# Patient Record
Sex: Female | Born: 1995 | Race: White | Hispanic: No | Marital: Single | Smoking: Current some day smoker
Health system: Southern US, Community
[De-identification: ages and names within clinical notes are randomized; demographics above are authoritative.]

## PROBLEM LIST (undated history)

## (undated) DIAGNOSIS — E063 Autoimmune thyroiditis: Secondary | ICD-10-CM

## (undated) DIAGNOSIS — F329 Major depressive disorder, single episode, unspecified: Secondary | ICD-10-CM

## (undated) DIAGNOSIS — F419 Anxiety disorder, unspecified: Secondary | ICD-10-CM

## (undated) DIAGNOSIS — F32A Depression, unspecified: Secondary | ICD-10-CM

## (undated) HISTORY — PX: KNEE SURGERY: SHX244

---
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
## 2019-08-26 ENCOUNTER — Emergency Department
Admission: EM | Admit: 2019-08-26 | Discharge: 2019-08-26 | Disposition: A | Discharge status: Home / Self Care | Payer: BC Managed Care – PPO | Attending: Student in an Organized Health Care Education/Training Program | Admitting: Student in an Organized Health Care Education/Training Program

## 2019-08-26 ENCOUNTER — Other Ambulatory Visit: Payer: Self-pay

## 2019-08-26 ENCOUNTER — Emergency Department: Payer: BC Managed Care – PPO

## 2019-08-26 ENCOUNTER — Encounter: Payer: Self-pay | Admitting: Emergency Medicine

## 2019-08-26 DIAGNOSIS — R630 Anorexia: Secondary | ICD-10-CM | POA: Insufficient documentation

## 2019-08-26 DIAGNOSIS — R110 Nausea: Secondary | ICD-10-CM | POA: Diagnosis not present

## 2019-08-26 DIAGNOSIS — Z79899 Other long term (current) drug therapy: Secondary | ICD-10-CM | POA: Insufficient documentation

## 2019-08-26 DIAGNOSIS — R5383 Other fatigue: Secondary | ICD-10-CM | POA: Insufficient documentation

## 2019-08-26 DIAGNOSIS — K529 Noninfective gastroenteritis and colitis, unspecified: Secondary | ICD-10-CM

## 2019-08-26 DIAGNOSIS — R1011 Right upper quadrant pain: Secondary | ICD-10-CM | POA: Diagnosis present

## 2019-08-26 HISTORY — DX: Depression, unspecified: F32.A

## 2019-08-26 HISTORY — DX: Autoimmune thyroiditis: E06.3

## 2019-08-26 HISTORY — DX: Anxiety disorder, unspecified: F41.9

## 2019-08-26 LAB — COMPREHENSIVE METABOLIC PANEL
ALT: 96 U/L — ABNORMAL HIGH (ref 0–44)
AST: 63 U/L — ABNORMAL HIGH (ref 15–41)
Albumin: 5 g/dL (ref 3.5–5.0)
Alkaline Phosphatase: 90 U/L (ref 38–126)
Anion gap: 11 (ref 5–15)
BUN: 12 mg/dL (ref 6–20)
CO2: 21 mmol/L — ABNORMAL LOW (ref 22–32)
Calcium: 10.1 mg/dL (ref 8.9–10.3)
Chloride: 106 mmol/L (ref 98–111)
Creatinine, Ser: 0.86 mg/dL (ref 0.44–1.00)
GFR calc Af Amer: >60 mL/min (ref >60)
GFR calc non Af Amer: >60 mL/min (ref >60)
Glucose, Bld: 88 mg/dL (ref 70–99)
Potassium: 4.6 mmol/L (ref 3.5–5.1)
Sodium: 138 mmol/L (ref 135–145)
Total Bilirubin: 1.1 mg/dL (ref 0.3–1.2)
Total Protein: 9.2 g/dL — ABNORMAL HIGH (ref 6.5–8.1)

## 2019-08-26 LAB — URINALYSIS, COMPLETE (UACMP) WITH MICROSCOPIC
Bacteria, UA: NONE SEEN
Bilirubin Urine: NEGATIVE
Glucose, UA: NEGATIVE mg/dL
Hgb urine dipstick: NEGATIVE
Ketones, ur: NEGATIVE mg/dL
Leukocytes,Ua: NEGATIVE
Nitrite: NEGATIVE
Protein, ur: NEGATIVE mg/dL
Specific Gravity, Urine: 1.004 — ABNORMAL LOW (ref 1.005–1.030)
pH: 6 (ref 5.0–8.0)

## 2019-08-26 LAB — CBC
HCT: 42.9 % (ref 36.0–46.0)
Hemoglobin: 14.2 g/dL (ref 12.0–15.0)
MCH: 27.6 pg (ref 26.0–34.0)
MCHC: 33.1 g/dL (ref 30.0–36.0)
MCV: 83.5 fL (ref 80.0–100.0)
Platelets: 252 10*3/uL (ref 150–400)
RBC: 5.14 MIL/uL — ABNORMAL HIGH (ref 3.87–5.11)
RDW: 13 % (ref 11.5–15.5)
WBC: 9.6 10*3/uL (ref 4.0–10.5)
nRBC: 0 % (ref 0.0–0.2)

## 2019-08-26 LAB — LIPASE, BLOOD: Lipase: 34 U/L (ref 11–51)

## 2019-08-26 LAB — POCT PREGNANCY, URINE: Preg Test, Ur: NEGATIVE

## 2019-08-26 MED ORDER — IOHEXOL 300 MG/ML  SOLN
100.0000 mL | Freq: Once | INTRAMUSCULAR | Status: AC | PRN
  Administered 2019-08-26: 100 mL via INTRAVENOUS
  Filled 2019-08-26: qty 100

## 2019-08-26 MED ORDER — DICYCLOMINE HCL 10 MG PO CAPS: 10.0000 mg | ORAL_CAPSULE | Freq: Four times a day (QID) | ORAL | 0 refills | Status: DC

## 2019-08-26 MED ORDER — PREDNISONE 10 MG (21) PO TBPK
ORAL_TABLET | ORAL | 0 refills | Status: DC
Start: 2019-08-26 — End: 2020-01-31

## 2019-08-26 MED ORDER — MORPHINE SULFATE (PF) 4 MG/ML IV SOLN
4.0000 mg | Freq: Once | INTRAVENOUS | Status: AC
  Administered 2019-08-26: 4 mg via INTRAVENOUS
  Filled 2019-08-26: qty 1

## 2019-08-26 MED ORDER — ONDANSETRON HCL 4 MG/2ML IJ SOLN
4.0000 mg | Freq: Once | INTRAMUSCULAR | Status: AC
  Administered 2019-08-26: 4 mg via INTRAVENOUS
  Filled 2019-08-26: qty 2

## 2019-08-26 NOTE — ED Notes (Signed)
See triage note  Presents with pain to RUQ     States she has not had fever or n/v/d  But has been tire  Afebrile on arrival

## 2019-08-26 NOTE — ED Triage Notes (Signed)
Pt here for RUQ pain from UC.  Also c/o fatigue. Sx since Tuesday.  No vomiting or diarrhe, + nausea. No fevers.  VSS. Unlabored. Color WNL

## 2019-08-26 NOTE — ED Notes (Addendum)
Pt updated in [facility], VS reassessed °

## 2019-08-26 NOTE — ED Triage Notes (Signed)
[facility] called with report, pt having ruq pain and needs to be evaluated for it

## 2019-08-26 NOTE — ED Notes (Addendum)
Iv pain med given   Side rails up and effects explained  Father at bedside describes pain as intermittent in intensity

## 2019-08-26 NOTE — ED Provider Notes (Signed)
[facility] Emergency Department Provider Note ____________________________________________   First MD Initiated Contact with Patient 08/26/19 1608     (approximate)  I have reviewed the triage vital signs and the nursing notes.   HISTORY  Chief Complaint Abdominal Pain  HPI Susan Velazquez is a 24 y.o. female who presents to the emergency department for treatment and evaluation of  RUQ pain with fatigue x 5 days. Sent here from [place].  She denies vomiting but has had a decrease in appetite and intermittent nausea.  No previous abdominal surgeries.  No fever.  Last menstrual cycle was 08/10/2019.       Past Medical History:  Diagnosis Date  . Anxiety   . Depression   . Hashimoto's disease     There are no problems to display for this patient.   Past Surgical History:  Procedure Laterality Date  . KNEE SURGERY      Prior to Admission medications   Medication Sig Start Date End Date Taking? Authorizing Provider  albuterol (VENTOLIN HFA) 108 (90 Base) MCG/ACT inhaler Inhale 2 puffs into the lungs every 6 (six) hours as needed for wheezing or shortness of breath.   Yes [provider]  buPROPion (WELLBUTRIN XL) 150 MG 24 hr tablet Take 150 mg by mouth daily.   Yes [provider]  gabapentin (NEURONTIN) 300 MG capsule Take 300 mg by mouth at bedtime.   Yes [provider]  levothyroxine (SYNTHROID) 50 MCG tablet Take 50 mcg by mouth daily before breakfast.   Yes [provider]  dicyclomine (BENTYL) 10 MG capsule Take 1 capsule (10 mg total) by mouth 4 (four) times daily for 14 days. 08/26/19 09/09/19  Mertice Uffelman, Dessa Phi, FNP  predniSONE (STERAPRED UNI-PAK 21 TAB) 10 MG (21) TBPK tablet Take 6 tablets on the first day and decrease by 1 tablet each day until finished. 08/26/19   Victorino Dike, FNP    Allergies Patient has no known allergies.  History reviewed. No pertinent family history.  Social  History Social History   Tobacco Use  . Smoking status: Never Smoker  . Smokeless tobacco: Never Used  Substance Use Topics  . Alcohol use: Never  . Drug use: Never    Review of Systems  Constitutional: No fever/chills Eyes: No visual changes. ENT: No sore throat. Cardiovascular: Denies chest pain. Respiratory: Denies shortness of breath. Gastrointestinal: Positive for right upper quadrant abdominal pain.  Positive for nausea.  Negative for vomiting or diarrhea.   Genitourinary: Negative for dysuria. Musculoskeletal: Negative for back pain. Skin: Negative for rash. Neurological: Negative for headaches, focal weakness or numbness. ____________________________________________   PHYSICAL EXAM:  VITAL SIGNS: ED Triage Vitals [08/26/19 1143]  Enc Vitals Group     BP (!) 134/92     Pulse Rate 87     Resp 18     Temp 98.6 F (37 C)     Temp Source Oral     SpO2 99 %     Weight 165 lb (74.8 kg)     Height _0  (1.6 m)     Head Circumference      Peak Flow      Pain Score 6     Pain Loc      Pain Edu?      Excl. in [place]?     Constitutional: Alert and oriented. Well appearing and in no acute distress. Eyes: Conjunctivae are normal. PERRL. EOMI. Head: Atraumatic. Nose: No congestion/rhinnorhea. Mouth/Throat: Mucous membranes  are moist.  Oropharynx non-erythematous. Neck: No stridor.   Hematological/Lymphatic/Immunilogical: No cervical lymphadenopathy. Cardiovascular: Normal rate, regular rhythm. Grossly normal heart sounds.  Good peripheral circulation. Respiratory: Normal respiratory effort.  No retractions. Lungs CTAB. Gastrointestinal: Soft and right upper quadrant tenderness. No distention. No abdominal bruits. No CVA tenderness. Genitourinary:  Musculoskeletal: No lower extremity tenderness nor edema.  No joint effusions. Neurologic:  Normal speech and language. No gross focal neurologic deficits are appreciated. No gait instability. Skin:  Skin is warm, dry and  intact. No rash noted. Psychiatric: Mood and affect are normal. Speech and behavior are normal.  ____________________________________________   LABS (all labs ordered are listed, but only abnormal results are displayed)  Labs Reviewed  COMPREHENSIVE METABOLIC PANEL - Abnormal; Notable for the following components:      Result Value   CO2 21 (*)    Total Protein 9.2 (*)    AST 63 (*)    ALT 96 (*)    All other components within normal limits  CBC - Abnormal; Notable for the following components:   RBC 5.14 (*)    All other components within normal limits  URINALYSIS, COMPLETE (UACMP) WITH MICROSCOPIC - Abnormal; Notable for the following components:   Color, Urine COLORLESS (*)    APPearance CLEAR (*)    Specific Gravity, Urine 1.004 (*)    All other components within normal limits  LIPASE, BLOOD  POC URINE PREG, ED  POCT PREGNANCY, URINE   ____________________________________________  EKG  Not indicated ____________________________________________  RADIOLOGY  ED MD interpretation:    Ultrasound of the right upper quadrant does not show any acute findings of concern.  CT of the abdomen pelvis shows mild wall thickening at the hepatic flexure with mild surrounding fat stranding suggested may be mild colitis.  No pericolonic free fluid or fluid collections.  Appendix is normal. Official radiology report(s): CT ABDOMEN PELVIS W CONTRAST  Result Date: 08/26/2019 CLINICAL DATA:  Abdominal pain worsening over the last 4 days EXAM: CT ABDOMEN AND PELVIS WITH CONTRAST TECHNIQUE: Multidetector CT imaging of the abdomen and pelvis was performed using the standard protocol following bolus administration of intravenous contrast. CONTRAST:  141m OMNIPAQUE IOHEXOL 300 MG/ML  SOLN COMPARISON:  None. FINDINGS: Lower chest: The visualized heart size within normal limits. No pericardial fluid/thickening. No hiatal hernia. The visualized portions of the lungs are clear. Hepatobiliary: The  liver is normal in density. There is a tiny 1 cm hypodense lesion seen adjacent to the falciform ligament. Main portal vein is patent. No evidence of calcified gallstones, gallbladder wall thickening or biliary dilatation. Pancreas: Unremarkable. No pancreatic ductal dilatation or surrounding inflammatory changes. Spleen: Normal in size without focal abnormality. Adrenals/Urinary Tract: Both adrenal glands appear normal. The kidneys and collecting system appear normal without evidence of urinary tract calculus or hydronephrosis. Bladder is unremarkable. Stomach/Bowel: The stomach and small bowel are normal in appearance. There appears to be mild wall thickening at the hepatic flexure with question of mild fat stranding changes. No loculated fluid collections or free air. There is a moderate amount of colonic stool present. The appendix is normal in appearance. Vascular/Lymphatic: There are no enlarged mesenteric, retroperitoneal, or pelvic lymph nodes. No significant vascular findings are present. Reproductive: The uterus and adnexa are unremarkable. There is a small amount of free fluid in the cul-de-sac. Other: No evidence of abdominal wall mass or hernia. Musculoskeletal: No acute or significant osseous findings. IMPRESSION: Question of mild wall thickening at the hepatic flexure with mild surrounding fat  stranding changes which could be due to mild colitis. No pericolonic free fluid or fluid collections. Normal appearing appendix. Electronically Signed   By: Prudencio Pair M.D.   On: 08/26/2019 19:06   US Abdomen Limited RUQ  Result Date: 08/26/2019 CLINICAL DATA:  Right upper quadrant abdominal pain and nausea for the past 3 days. EXAM: ULTRASOUND ABDOMEN LIMITED RIGHT UPPER QUADRANT COMPARISON:  None. FINDINGS: Gallbladder: No gallstones or wall thickening visualized. No sonographic Murphy sign noted by sonographer. Common bile duct: Diameter: 2.6 mm Liver: No focal lesion identified. Within normal limits in  parenchymal echogenicity. Portal vein is patent on color Doppler imaging with normal direction of blood flow towards the liver. Other: None. IMPRESSION: Normal examination. Electronically Signed   By: Claudie Revering M.D.   On: 08/26/2019 17:39    ____________________________________________   PROCEDURES  Procedure(s) performed (including Critical Care):  Procedures  ____________________________________________   INITIAL IMPRESSION / ASSESSMENT AND PLAN    24 year old female presenting to the emergency department for treatment and evaluation of abdominal pain.  See HPI for further details.  Labs drawn while awaiting ER room assignment are reassuring.  She is pregnant.  White count is normal at 9.6.  Electrolytes are unremarkable.  She does have a mild elevation of her AST and ALT with a normal alk phos and bilirubin.  Urinalysis is clean.  If ultrasound of the right upper quadrant does not reveal any sign of acute cholecystitis will order CT. Patient is agreeable to the plan.   DIFFERENTIAL DIAGNOSIS  Acute cholecystitis, appendicitis, diverticulitis, diverticulosis, gastritis  ED COURSE  [place] is negative for acute findings per radiology. Will order CT. Patient continues to complain of abdominal pain and does appear uncomfortable. Zofran and morphine ordered. ____________________________________________   FINAL CLINICAL IMPRESSION(S) / ED DIAGNOSES  Final diagnoses:  RUQ pain  Colitis     ED Discharge Orders         Ordered    predniSONE (STERAPRED UNI-PAK 21 TAB) 10 MG (21) TBPK tablet     08/26/19 1919    dicyclomine (BENTYL) 10 MG capsule  4 times daily     08/26/19 1919           Susan Velazquez was evaluated in Emergency Department on 08/26/2019 for the symptoms described in the history of present illness. She was evaluated in the context of the global COVID-19 pandemic, which necessitated consideration that the patient might be at risk for infection with the  SARS-CoV-2 virus that causes COVID-19. Institutional protocols and algorithms that pertain to the evaluation of patients at risk for COVID-19 are in a state of rapid change based on information released by regulatory bodies including the CDC and federal and state organizations. These policies and algorithms were followed during the patient's care in the ED.   Note:  This document was prepared using Dragon voice recognition software and may include unintentional dictation errors.   Victorino Dike, FNP 08/26/19 2207    Merlyn Lot, MD 08/26/19 2226

## 2019-08-28 ENCOUNTER — Emergency Department: Payer: BC Managed Care – PPO

## 2019-08-28 ENCOUNTER — Emergency Department
Admission: EM | Admit: 2019-08-28 | Discharge: 2019-08-28 | Disposition: A | Discharge status: Home / Self Care | Payer: BC Managed Care – PPO | Attending: Emergency Medicine | Admitting: Emergency Medicine

## 2019-08-28 ENCOUNTER — Other Ambulatory Visit: Payer: Self-pay

## 2019-08-28 DIAGNOSIS — B9689 Other specified bacterial agents as the cause of diseases classified elsewhere: Secondary | ICD-10-CM | POA: Insufficient documentation

## 2019-08-28 DIAGNOSIS — K529 Noninfective gastroenteritis and colitis, unspecified: Secondary | ICD-10-CM | POA: Diagnosis not present

## 2019-08-28 DIAGNOSIS — R109 Unspecified abdominal pain: Secondary | ICD-10-CM | POA: Diagnosis present

## 2019-08-28 DIAGNOSIS — N760 Acute vaginitis: Secondary | ICD-10-CM | POA: Insufficient documentation

## 2019-08-28 DIAGNOSIS — R102 Pelvic and perineal pain: Secondary | ICD-10-CM

## 2019-08-28 LAB — COMPREHENSIVE METABOLIC PANEL
ALT: 89 U/L — ABNORMAL HIGH (ref 0–44)
AST: 59 U/L — ABNORMAL HIGH (ref 15–41)
Albumin: 4.9 g/dL (ref 3.5–5.0)
Alkaline Phosphatase: 77 U/L (ref 38–126)
Anion gap: 11 (ref 5–15)
BUN: 16 mg/dL (ref 6–20)
CO2: 25 mmol/L (ref 22–32)
Calcium: 10.1 mg/dL (ref 8.9–10.3)
Chloride: 102 mmol/L (ref 98–111)
Creatinine, Ser: 0.89 mg/dL (ref 0.44–1.00)
GFR calc Af Amer: >60 mL/min (ref >60)
GFR calc non Af Amer: >60 mL/min (ref >60)
Glucose, Bld: 111 mg/dL — ABNORMAL HIGH (ref 70–99)
Potassium: 4.3 mmol/L (ref 3.5–5.1)
Sodium: 138 mmol/L (ref 135–145)
Total Bilirubin: 1.2 mg/dL (ref 0.3–1.2)
Total Protein: 8.8 g/dL — ABNORMAL HIGH (ref 6.5–8.1)

## 2019-08-28 LAB — CBC
HCT: 40.7 % (ref 36.0–46.0)
Hemoglobin: 13.9 g/dL (ref 12.0–15.0)
MCH: 27.3 pg (ref 26.0–34.0)
MCHC: 34.2 g/dL (ref 30.0–36.0)
MCV: 79.8 fL — ABNORMAL LOW (ref 80.0–100.0)
Platelets: 377 10*3/uL (ref 150–400)
RBC: 5.1 MIL/uL (ref 3.87–5.11)
RDW: 13.1 % (ref 11.5–15.5)
WBC: 12.3 10*3/uL — ABNORMAL HIGH (ref 4.0–10.5)
nRBC: 0 % (ref 0.0–0.2)

## 2019-08-28 LAB — WET PREP, GENITAL
Sperm: NONE SEEN
Trich, Wet Prep: NONE SEEN
Yeast Wet Prep HPF POC: NONE SEEN

## 2019-08-28 LAB — URINALYSIS, COMPLETE (UACMP) WITH MICROSCOPIC
Bilirubin Urine: NEGATIVE
Glucose, UA: NEGATIVE mg/dL
Hgb urine dipstick: NEGATIVE
Ketones, ur: 5 mg/dL — AB
Leukocytes,Ua: NEGATIVE
Nitrite: NEGATIVE
Protein, ur: NEGATIVE mg/dL
Specific Gravity, Urine: 1.018 (ref 1.005–1.030)
pH: 8 (ref 5.0–8.0)

## 2019-08-28 LAB — CHLAMYDIA/NGC RT PCR (ARMC ONLY)
Chlamydia Tr: NOT DETECTED
N gonorrhoeae: NOT DETECTED

## 2019-08-28 LAB — LIPASE, BLOOD: Lipase: 30 U/L (ref 11–51)

## 2019-08-28 LAB — POCT PREGNANCY, URINE: Preg Test, Ur: NEGATIVE

## 2019-08-28 MED ORDER — MORPHINE SULFATE (PF) 4 MG/ML IV SOLN
4.0000 mg | Freq: Once | INTRAVENOUS | Status: AC
  Administered 2019-08-28: 4 mg via INTRAVENOUS
  Filled 2019-08-28: qty 1

## 2019-08-28 MED ORDER — ONDANSETRON HCL 4 MG/2ML IJ SOLN
4.0000 mg | Freq: Once | INTRAMUSCULAR | Status: AC
  Administered 2019-08-28: 4 mg via INTRAVENOUS
  Filled 2019-08-28: qty 2

## 2019-08-28 MED ORDER — METRONIDAZOLE 500 MG PO TABS
500.0000 mg | ORAL_TABLET | Freq: Three times a day (TID) | ORAL | 0 refills | Status: AC
Start: 2019-08-28 — End: 2019-09-04

## 2019-08-28 MED ORDER — CIPROFLOXACIN HCL 500 MG PO TABS
500.0000 mg | ORAL_TABLET | Freq: Two times a day (BID) | ORAL | 0 refills | Status: AC
Start: 2019-08-28 — End: 2019-09-04

## 2019-08-28 MED ORDER — OXYCODONE-ACETAMINOPHEN 5-325 MG PO TABS
1.0000 | ORAL_TABLET | ORAL | Status: DC | PRN
  Administered 2019-08-28: 1 via ORAL
  Filled 2019-08-28: qty 1

## 2019-08-28 MED ORDER — OXYCODONE-ACETAMINOPHEN 5-325 MG PO TABS: 1.0000 | ORAL_TABLET | Freq: Three times a day (TID) | ORAL | 0 refills | Status: DC | PRN

## 2019-08-28 NOTE — ED Provider Notes (Signed)
ER Provider Note       Time seen: 3:10 PM    I have reviewed the vital signs and the nursing notes.  HISTORY   Chief Complaint Abdominal Pain    HPI Susan Velazquez is a 24 y.o. female with a history of anxiety, depression, Hashimoto's thyroiditis who presents today for persistent abdominal pain with back pain.  Patient was seen Friday for similar.  Denies any urinary symptoms, has had some diarrhea and nausea.  Discomfort is 6 out of 10 in the right upper and left lower abdomen.  Past Medical History:  Diagnosis Date  . Anxiety   . Depression   . Hashimoto's disease     Past Surgical History:  Procedure Laterality Date  . KNEE SURGERY      Allergies Patient has no known allergies.   Review of Systems Constitutional: Negative for fever. Cardiovascular: Negative for chest pain. Respiratory: Negative for shortness of breath. Gastrointestinal: Positive for abdominal pain, nausea and diarrhea Musculoskeletal: Negative for back pain. Skin: Negative for rash. Neurological: Negative for headaches, focal weakness or numbness.  All systems negative/normal/unremarkable except as stated in the HPI  ____________________________________________   PHYSICAL EXAM:  VITAL SIGNS: Vitals:   08/28/19 1230 08/28/19 1500  BP: 133/82 136/75  Pulse: 98 88  Resp: 16 20  Temp: 98.6 F (37 C)   SpO2: 98% 94%    Constitutional: Alert and oriented. Well appearing and in no distress. Eyes: Conjunctivae are normal. Normal extraocular movements. Cardiovascular: Normal rate, regular rhythm. No murmurs, rubs, or gallops. Respiratory: Normal respiratory effort without tachypnea nor retractions. Breath sounds are clear and equal bilaterally. No wheezes/rales/rhonchi. Gastrointestinal: Right upper and left lower quadrant tenderness, no rebound or guarding.  Normal bowel sounds. Genitourinary: Left-sided adnexal tenderness, white vaginal discharge Musculoskeletal: Nontender with  normal range of motion in extremities. No lower extremity tenderness nor edema. Neurologic:  Normal speech and language. No gross focal neurologic deficits are appreciated.  Skin:  Skin is warm, dry and intact. No rash noted. Psychiatric: Speech and behavior are normal.  ____________________________________________   LABS (pertinent positives/negatives)  Labs Reviewed  WET PREP, GENITAL - Abnormal; Notable for the following components:      Result Value   Clue Cells Wet Prep HPF POC PRESENT (*)    WBC, Wet Prep HPF POC FEW (*)    All other components within normal limits  COMPREHENSIVE METABOLIC PANEL - Abnormal; Notable for the following components:   Glucose, Bld 111 (*)    Total Protein 8.8 (*)    AST 59 (*)    ALT 89 (*)    All other components within normal limits  CBC - Abnormal; Notable for the following components:   WBC 12.3 (*)    MCV 79.8 (*)    All other components within normal limits  URINALYSIS, COMPLETE (UACMP) WITH MICROSCOPIC - Abnormal; Notable for the following components:   Color, Urine YELLOW (*)    APPearance HAZY (*)    Ketones, ur 5 (*)    Bacteria, UA RARE (*)    All other components within normal limits  CHLAMYDIA/NGC RT PCR ([facility] ONLY)  LIPASE, BLOOD  HEPATITIS PANEL, ACUTE  POC URINE PREG, ED  POCT PREGNANCY, URINE    RADIOLOGY  Images were viewed by me Pelvic ultrasound IMPRESSION: 1. Normal sonographic appearance of the uterus and right ovary. Normal right ovarian blood flow. 2. Left ovary is not visualized, however ovary appeared normal on CT 2 days ago.   DIFFERENTIAL  DIAGNOSIS  PID, Fitzhugh Curtis syndrome, colitis, gastroenteritis, dehydration, electrolyte abnormality, IBS, inflammatory bowel disorder  ASSESSMENT AND PLAN  Abdominal pain, colitis, bacterial vaginosis   Plan: The patient had presented for persistent abdominal pain. Patient's labs did reveal an elevated white blood cell count which is likely  secondary to steroids.  Unclear etiology for her elevated LFTs.  I will place her on antibiotics and refer her to GI for outpatient follow-up.   Lenise Arena MD    Note: This note was generated in part or whole with voice recognition software. Voice recognition is usually quite accurate but there are transcription errors that can and very often do occur. I apologize for any typographical errors that were not detected and corrected.     Earleen Newport, MD 08/28/19 [phone]

## 2019-08-28 NOTE — ED Notes (Signed)
Pt transported to room 10 temporarily for pelvic exam.

## 2019-08-28 NOTE — ED Notes (Signed)
Pt transport to [place] at this time.

## 2019-08-28 NOTE — ED Triage Notes (Signed)
Pt c/o RUQ and LLQ abd pain with back pain. States seen Friday for same. Denies urinary symptoms. States diarrhea.   A&O, ambulatory.

## 2019-10-13 ENCOUNTER — Ambulatory Visit: Payer: BC Managed Care – PPO | Admitting: Gastroenterology

## 2020-01-31 ENCOUNTER — Ambulatory Visit
Admission: EM | Admit: 2020-01-31 | Discharge: 2020-01-31 | Disposition: A | Discharge status: Home / Self Care | Payer: Worker's Compensation | Attending: Emergency Medicine | Admitting: Emergency Medicine

## 2020-01-31 ENCOUNTER — Ambulatory Visit (INDEPENDENT_AMBULATORY_CARE_PROVIDER_SITE_OTHER): Payer: Worker's Compensation

## 2020-01-31 ENCOUNTER — Other Ambulatory Visit: Payer: Self-pay

## 2020-01-31 DIAGNOSIS — S63602A Unspecified sprain of left thumb, initial encounter: Secondary | ICD-10-CM | POA: Diagnosis not present

## 2020-01-31 DIAGNOSIS — X501XXA Overexertion from prolonged static or awkward postures, initial encounter: Secondary | ICD-10-CM

## 2020-01-31 DIAGNOSIS — M25542 Pain in joints of left hand: Secondary | ICD-10-CM | POA: Diagnosis not present

## 2020-01-31 NOTE — ED Provider Notes (Signed)
[facility]    CSN: [id number] Arrival date & time: 01/31/20  1652      History   Chief Complaint Chief Complaint  Patient presents with   Work Related Injury   Finger Injury    left thumb    HPI Susan Velazquez is a 24 y.o. female.   24 year old female here for evaluation of left thumb injury.  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  Patient is unsure of exactly what happened to her thumb but she is unable to straighten her thumb or flex her thumb. Patient has taken ibuprofen for pain.     Past Medical History:  Diagnosis Date   Anxiety    Depression    Hashimoto's disease     There are no problems to display for this patient.   Past Surgical History:  Procedure Laterality Date   KNEE SURGERY      OB History   No obstetric history on file.      Home Medications    Prior to Admission medications   Medication Sig Start Date End Date Taking? Authorizing Provider  levothyroxine (SYNTHROID) 50 MCG tablet Take 50 mcg by mouth daily before breakfast.   Yes [provider]  dicyclomine (BENTYL) 10 MG capsule Take 1 capsule (10 mg total) by mouth 4 (four) times daily for 14 days. 08/26/19 09/09/19  Triplett, Kasandra Knudsen, FNP  albuterol (VENTOLIN HFA) 108 (90 Base) MCG/ACT inhaler Inhale 2 puffs into the lungs every 6 (six) hours as needed for wheezing or shortness of breath.  01/31/20  [provider]  buPROPion (WELLBUTRIN XL) 150 MG 24 hr tablet Take 150 mg by mouth daily.  01/31/20  [provider]  gabapentin (NEURONTIN) 300 MG capsule Take 300 mg by mouth at bedtime.  01/31/20  [provider]    Family History History reviewed. No pertinent family history.  Social History Social History   Tobacco Use   Smoking status: Never Smoker   Smokeless tobacco: Never Used  Building services engineer Use: Never used  Substance Use Topics   Alcohol  use: Never   Drug use: Never     Allergies   Patient has no known allergies.   Review of Systems Review of Systems  Constitutional: Negative for activity change and fever.  Musculoskeletal: Positive for arthralgias.  Skin: Negative for rash and wound.  Neurological: Positive for weakness and numbness.  Hematological: Negative.      Physical Exam Triage Vital Signs ED Triage Vitals  Enc Vitals Group     BP 01/31/20 1723 115/68     Pulse Rate 01/31/20 1723 77     Resp 01/31/20 1723 17     Temp 01/31/20 1723 98.2 F (36.8 C)     Temp Source 01/31/20 1723 Oral     SpO2 01/31/20 1723 100 %     Weight 01/31/20 1720 150 lb (68 kg)     Height 01/31/20 1720 5\' 3"  (1.6 m)     Head Circumference --      Peak Flow --      Pain Score 01/31/20 1719 6     Pain Loc --      Pain Edu? --      Excl. in [place]? --    No data found.  Updated Vital Signs BP 115/68 (BP Location: Right Arm)    Pulse 77    Temp 98.2  F (36.8 C) (Oral)    Resp 17    Ht 5\' 3"  (1.6 m)    Wt 150 lb (68 kg)    LMP 01/29/2020    SpO2 100%    BMI 26.57 kg/m   Visual Acuity Right Eye Distance:   Left Eye Distance:   Bilateral Distance:    Right Eye Near:   Left Eye Near:    Bilateral Near:     Physical Exam Vitals and nursing note reviewed.  Constitutional:      General: She is not in acute distress.    Appearance: Normal appearance. She is normal weight. She is not toxic-appearing.  HENT:     Head: Normocephalic and atraumatic.  Eyes:     Extraocular Movements: Extraocular movements intact.     Conjunctiva/sclera: Conjunctivae normal.     Pupils: Pupils are equal, round, and reactive to light.  Musculoskeletal:        General: Swelling and tenderness present. No deformity or signs of injury.     Comments: There is tenderness and ecchymosis to the MTP of the left thumb.  Patient is complaining of tenderness when the joint is palpated.  No tenderness to the proximal or distal phalanx of the left thumb.   Patient also complaining of pain when palpating the metacarpal of the left thumb.  Patient is able to flex her thumb against resistance but not extend her thumb against resistance.  Also patient cannot give a thumbs up on her own.  Patient has sensation.  Skin:    General: Skin is warm and dry.     Capillary Refill: Capillary refill takes less than 2 seconds.     Findings: Bruising present. No erythema or rash.  Neurological:     General: No focal deficit present.     Mental Status: She is alert and oriented to person, place, and time.  Psychiatric:        Mood and Affect: Mood normal.        Behavior: Behavior normal.        Thought Content: Thought content normal.        Judgment: Judgment normal.      UC Treatments / Results  Labs (all labs ordered are listed, but only abnormal results are displayed) Labs Reviewed - No data to display  EKG   Radiology DG Hand Complete Left  Result Date: 01/31/2020 CLINICAL DATA:  Pain the first MTP joint following twisting injury, initial encounter EXAM: LEFT HAND - COMPLETE 3+ VIEW COMPARISON:  None. FINDINGS: There is no evidence of fracture or dislocation. There is no evidence of arthropathy or other focal bone abnormality. Soft tissues are unremarkable. IMPRESSION: No acute abnormality noted. Electronically Signed   By: 13/05/2019 M.D.   On: 01/31/2020 18:04    Procedures Procedures (including critical care time)  Medications Ordered in UC Medications - No data to display  Initial Impression / Assessment and Plan / UC Course  I have reviewed the triage vital signs and the nursing notes.  Pertinent labs & imaging results that were available during my care of the patient were reviewed by me and considered in my medical decision making (see chart for details).   Patient is here for evaluation of left thumb injury that happened while at work.  She thinks that her thumb may have gotten wrapped up in a towel and then a puppy pulled the  towel and may have forcefully flexed her thumb down but she is unsure.  She has tenderness of the MTP joint and the metacarpal of the left thumb.  There is some mild swelling and ecchymosis present.  Tenderness to the MTP joint on palpation.  Major concern is patient can flex against resistance but cannot extend against resistance or on her own.  Will obtain radiograph to look for fracture.  X-ray of left hand did not reveal the presence of any fracture per radiology read.  Will put patient in wrist spica splint and have her follow-up with orthopedics.   Final Clinical Impressions(s) / UC Diagnoses   Final diagnoses:  Sprain of left thumb, unspecified site of digit, initial encounter     Discharge Instructions     There was no evidence of fracture on your x-ray.  This does not rule out ligament or tendon injury.  Given your inability to straighten your thumb I am very concerned that you have a ligament injury and you need to follow-up with orthopedics to have that further evaluated.  Wear the thumb splint at all times unless washing your hands until evaluated by orthopedics.    ED Prescriptions    None     PDMP not reviewed this encounter.   Becky Augusta, NP 01/31/20 1816

## 2020-01-31 NOTE — ED Triage Notes (Signed)
Patient states that she had a left thumb injury that occurred at work. States that she was dealing with a rambunctious puppy and her finger got wedged between him and a towel, reports that knuckle has been painful and swollen since, states that this occurred around 330pm.

## 2020-01-31 NOTE — Discharge Instructions (Signed)
There was no evidence of fracture on your x-ray.  This does not rule out ligament or tendon injury.  Given your inability to straighten your thumb I am very concerned that you have a ligament injury and you need to follow-up with orthopedics to have that further evaluated.  Wear the thumb splint at all times unless washing your hands until evaluated by orthopedics.

## 2020-05-04 ENCOUNTER — Other Ambulatory Visit: Payer: Self-pay

## 2020-05-04 ENCOUNTER — Emergency Department
Admission: EM | Admit: 2020-05-04 | Discharge: 2020-05-04 | Disposition: A | Discharge status: Home / Self Care | Payer: BC Managed Care – PPO | Attending: Emergency Medicine | Admitting: Emergency Medicine

## 2020-05-04 ENCOUNTER — Emergency Department: Payer: BC Managed Care – PPO

## 2020-05-04 DIAGNOSIS — R55 Syncope and collapse: Secondary | ICD-10-CM | POA: Diagnosis not present

## 2020-05-04 DIAGNOSIS — Z20822 Contact with and (suspected) exposure to covid-19: Secondary | ICD-10-CM | POA: Diagnosis not present

## 2020-05-04 DIAGNOSIS — R110 Nausea: Secondary | ICD-10-CM | POA: Diagnosis not present

## 2020-05-04 DIAGNOSIS — R1011 Right upper quadrant pain: Secondary | ICD-10-CM | POA: Diagnosis not present

## 2020-05-04 LAB — URINALYSIS, COMPLETE (UACMP) WITH MICROSCOPIC
Bacteria, UA: NONE SEEN
Bilirubin Urine: NEGATIVE
Glucose, UA: NEGATIVE mg/dL
Hgb urine dipstick: NEGATIVE
Ketones, ur: NEGATIVE mg/dL
Leukocytes,Ua: NEGATIVE
Nitrite: NEGATIVE
Protein, ur: NEGATIVE mg/dL
Specific Gravity, Urine: 1.003 — ABNORMAL LOW (ref 1.005–1.030)
pH: 6 (ref 5.0–8.0)

## 2020-05-04 LAB — COMPREHENSIVE METABOLIC PANEL
ALT: 12 U/L (ref 0–44)
AST: 21 U/L (ref 15–41)
Albumin: 3.9 g/dL (ref 3.5–5.0)
Alkaline Phosphatase: 44 U/L (ref 38–126)
Anion gap: 10 (ref 5–15)
BUN: 14 mg/dL (ref 6–20)
CO2: 23 mmol/L (ref 22–32)
Calcium: 9 mg/dL (ref 8.9–10.3)
Chloride: 105 mmol/L (ref 98–111)
Creatinine, Ser: 0.8 mg/dL (ref 0.44–1.00)
GFR, Estimated: >60 mL/min (ref >60)
Glucose, Bld: 116 mg/dL — ABNORMAL HIGH (ref 70–99)
Potassium: 3.8 mmol/L (ref 3.5–5.1)
Sodium: 138 mmol/L (ref 135–145)
Total Bilirubin: 0.2 mg/dL — ABNORMAL LOW (ref 0.3–1.2)
Total Protein: 8.1 g/dL (ref 6.5–8.1)

## 2020-05-04 LAB — SARS CORONAVIRUS 2 BY RT PCR (HOSPITAL ORDER, PERFORMED IN ~~LOC~~ HOSPITAL LAB): SARS Coronavirus 2: NEGATIVE

## 2020-05-04 LAB — CBC
HCT: 37.5 % (ref 36.0–46.0)
Hemoglobin: 12.7 g/dL (ref 12.0–15.0)
MCH: 28.9 pg (ref 26.0–34.0)
MCHC: 33.9 g/dL (ref 30.0–36.0)
MCV: 85.4 fL (ref 80.0–100.0)
Platelets: 322 10*3/uL (ref 150–400)
RBC: 4.39 MIL/uL (ref 3.87–5.11)
RDW: 12.2 % (ref 11.5–15.5)
WBC: 7.3 10*3/uL (ref 4.0–10.5)
nRBC: 0 % (ref 0.0–0.2)

## 2020-05-04 LAB — POC URINE PREG, ED: Preg Test, Ur: NEGATIVE

## 2020-05-04 LAB — CBG MONITORING, ED: Glucose-Capillary: 110 mg/dL — ABNORMAL HIGH (ref 70–99)

## 2020-05-04 LAB — T4, FREE: Free T4: 0.78 ng/dL (ref 0.61–1.12)

## 2020-05-04 LAB — LIPASE, BLOOD: Lipase: 40 U/L (ref 11–51)

## 2020-05-04 NOTE — ED Notes (Signed)
ED Provider at bedside with update regarding plan of care. Patient resting with visitor at bedside.

## 2020-05-04 NOTE — ED Triage Notes (Addendum)
Pt is a 25 y/o f with a hx of Hashimoto's coming from work via EMS with a cc of generalized weakness, dizziness, and right sided abdominal pain. Per patient coworker, patient became pale and then had one syncopal episode for a brief period. No seizure like activity reported. Pt denies chest pain and SOB. RUQ abdominal discomfort also noted. Denies associated urinary symptoms. One episode of syncope noted with medic. 4mg  zofran IV administered PTA which relieved nausea. No vomiting reported.

## 2020-05-04 NOTE — ED Provider Notes (Signed)
[facility] Emergency Department Provider Note   ____________________________________________   Event Date/Time   First MD Initiated Contact with Patient 05/04/20 1123     (approximate)  I have reviewed the triage vital signs and the nursing notes.   HISTORY  Chief Complaint Loss of Consciousness and Abdominal Pain    HPI Susan Velazquez is a 25 y.o. female with a stated past medical history of anxiety/depression Hashimoto's thyroiditis who presents for an episode of syncope associated with right upper quadrant abdominal pain via EMS.  Patient was transported from her job where folks noted that she became pale and had an episode of loss of consciousness without any shaking or seizure-like activity, oral trauma, or bowel/bladder incontinence.  Patient awoke and returned to baseline in less than 2 minutes and is now only complaining of nonradiating, aching, right upper quadrant abdominal pain that is remained stable since onset and is associated with nausea.  Patient received Zofran in route and states that her nausea is well controlled at this time.  Patient denies any head trauma or history of syncope.  Patient currently denies any vision changes, tinnitus, difficulty speaking, facial droop, sore throat, chest pain, shortness of breath,  vomiting/diarrhea, dysuria, or weakness/numbness/paresthesias in any extremity         Past Medical History:  Diagnosis Date  . Anxiety   . Depression   . Hashimoto's disease     There are no problems to display for this patient.   Past Surgical History:  Procedure Laterality Date  . KNEE SURGERY      Prior to Admission medications   Medication Sig Start Date End Date Taking? Authorizing Provider  dicyclomine (BENTYL) 10 MG capsule Take 1 capsule (10 mg total) by mouth 4 (four) times daily for 14 days. 08/26/19 09/09/19  Triplett, Kasandra Knudsen, FNP  levothyroxine (SYNTHROID) 50 MCG tablet Take 50 mcg by mouth daily  before breakfast.    [provider]  albuterol (VENTOLIN HFA) 108 (90 Base) MCG/ACT inhaler Inhale 2 puffs into the lungs every 6 (six) hours as needed for wheezing or shortness of breath.  01/31/20  [provider]  buPROPion (WELLBUTRIN XL) 150 MG 24 hr tablet Take 150 mg by mouth daily.  01/31/20  [provider]  gabapentin (NEURONTIN) 300 MG capsule Take 300 mg by mouth at bedtime.  01/31/20  [provider]    Allergies Cymbalta [duloxetine hcl] and Iodine  History reviewed. No pertinent family history.  Social History Social History   Tobacco Use  . Smoking status: Never Smoker  . Smokeless tobacco: Never Used  Vaping Use  . Vaping Use: Never used  Substance Use Topics  . Alcohol use: Never  . Drug use: Never    Review of Systems Constitutional: No fever/chills Eyes: No visual changes. ENT: No sore throat. Cardiovascular: Denies chest pain. Respiratory: Denies shortness of breath. Gastrointestinal: Endorses abdominal pain.  Endorses nausea, no vomiting.  No diarrhea. Genitourinary: Negative for dysuria. Musculoskeletal: Negative for acute arthralgias Skin: Negative for rash. Neurological: Negative for headaches, weakness/numbness/paresthesias in any extremity Psychiatric: Negative for suicidal ideation/homicidal ideation   ____________________________________________   PHYSICAL EXAM:  VITAL SIGNS: ED Triage Vitals  Enc Vitals Group     BP 05/04/20 1130 130/85     Pulse Rate 05/04/20 1130 82     Resp 05/04/20 1130 (!) 21     Temp 05/04/20 1128 98.6 F (37 C)     Temp Source 05/04/20 1128 Oral  SpO2 05/04/20 1124 100 %     Weight 05/04/20 1129 160 lb (72.6 kg)     Height 05/04/20 1129 5\' 3"  (1.6 m)     Head Circumference --      Peak Flow --      Pain Score 05/04/20 1128 3     Pain Loc --      Pain Edu? --      Excl. in GC? --    Constitutional: Alert and oriented. Well appearing and in no acute distress. Eyes:  Conjunctivae are normal. PERRL. Head: Atraumatic. Nose: No congestion/rhinnorhea. Mouth/Throat: Mucous membranes are moist. Neck: No stridor Cardiovascular: Grossly normal heart sounds.  Good peripheral circulation. Respiratory: Normal respiratory effort.  No retractions. Gastrointestinal: Soft and nontender. No distention. Musculoskeletal: No obvious deformities Neurologic:  Normal speech and language. No gross focal neurologic deficits are appreciated. Skin:  Skin is warm and dry. No rash noted. Psychiatric: Mood and affect are normal. Speech and behavior are normal.  ____________________________________________   LABS (all labs ordered are listed, but only abnormal results are displayed)  Labs Reviewed  COMPREHENSIVE METABOLIC PANEL - Abnormal; Notable for the following components:      Result Value   Glucose, Bld 116 (*)    Total Bilirubin 0.2 (*)    All other components within normal limits  URINALYSIS, COMPLETE (UACMP) WITH MICROSCOPIC - Abnormal; Notable for the following components:   Color, Urine STRAW (*)    APPearance CLEAR (*)    Specific Gravity, Urine 1.003 (*)    All other components within normal limits  CBG MONITORING, ED - Abnormal; Notable for the following components:   Glucose-Capillary 110 (*)    All other components within normal limits  SARS CORONAVIRUS 2 BY RT PCR (HOSPITAL ORDER, PERFORMED IN [facility])  LIPASE, BLOOD  CBC  T4, FREE  POC URINE PREG, ED   ____________________________________________  EKG  ED ECG REPORT I, 07/02/20, the attending physician, personally viewed and interpreted this ECG.  Date: 05/04/2020 EKG Time: 1126 Rate: 91 Rhythm: normal sinus rhythm QRS Axis: normal Intervals: normal ST/T Wave abnormalities: normal Narrative Interpretation: no evidence of acute ischemia  ____________________________________________  RADIOLOGY  ED MD interpretation: Ultrasound of the right upper quadrant shows no  evidence of acute abnormalities including no focal lesions, gallstones, or gallbladder wall thickening  Official radiology report(s): 07/02/2020 Abdomen Limited RUQ (LIVER/GB)  Result Date: 05/04/2020 CLINICAL DATA:  Postprandial right upper quadrant pain EXAM: ULTRASOUND ABDOMEN LIMITED RIGHT UPPER QUADRANT COMPARISON:  None. FINDINGS: Gallbladder: No gallstones or wall thickening visualized. No sonographic Murphy sign noted by sonographer. Common bile duct: Diameter: 4 mm Liver: No focal lesion identified. Within normal limits in parenchymal echogenicity. Portal vein is patent on color Doppler imaging with normal direction of blood flow towards the liver. Other: None. IMPRESSION: Unremarkable right upper quadrant ultrasound. Electronically Signed   By: 07/02/2020 MD   On: 05/04/2020 15:28    ____________________________________________   PROCEDURES  Procedure(s) performed (including Critical Care):  .1-3 Lead EKG Interpretation Performed by: 07/02/2020, MD Authorized by: Merwyn Katos, MD     Interpretation: normal     ECG rate:  79   ECG rate assessment: normal     Rhythm: sinus rhythm     Ectopy: none     Conduction: normal       ____________________________________________   INITIAL IMPRESSION / ASSESSMENT AND PLAN / ED COURSE  As part of my medical decision making,  I reviewed the following data within the electronic MEDICAL RECORD NUMBER Nursing notes reviewed and incorporated, Labs reviewed, EKG interpreted, Old chart reviewed, Radiograph reviewed and Notes from prior ED visits reviewed and incorporated        Patient presents with complaints of syncope/presyncope ED Workup:  CBC, BMP, Troponin, BNP, ECG, CXR Differential diagnosis includes HF, ICH, seizure, stroke, HOCM, ACS, aortic dissection, malignant arrhythmia, or GI bleed. Findings: No evidence of acute laboratory abnormalities.  Troponin negative x1 EKG: No e/o STEMI. No evidence of Brugadas sign, delta wave,  epsilon wave, significantly prolonged QTc, or malignant arrhythmia. Patients symptoms not typical for emergent causes of abdominal pain such as, but not limited to, appendicitis, abdominal aortic aneurysm, surgical biliary disease, pancreatitis, SBO, mesenteric ischemia, serious intra-abdominal bacterial illness. Presentation also not typical of gynecologic emergencies such as TOA, Ovarian Torsion, PID. Not Ectopic. Doubt atypical ACS.  Pt tolerating PO. Disposition: Discharge. Patient is at baseline at this time. Return precautions expressed and understood in person. Advised follow up with primary care provider or clinic physician in next 24 hours.      ____________________________________________   FINAL CLINICAL IMPRESSION(S) / ED DIAGNOSES  Final diagnoses:  Postprandial abdominal pain in right upper quadrant  Syncope and collapse  Right upper quadrant abdominal pain     ED Discharge Orders    None       Note:  This document was prepared using Dragon voice recognition software and may include unintentional dictation errors.   Merwyn Katos, MD 05/04/20 1540

## 2020-09-27 ENCOUNTER — Other Ambulatory Visit: Payer: Self-pay | Admitting: Physician Assistant

## 2020-09-27 ENCOUNTER — Other Ambulatory Visit (HOSPITAL_COMMUNITY): Payer: Self-pay | Admitting: Physician Assistant

## 2020-09-29 ENCOUNTER — Other Ambulatory Visit: Payer: Self-pay

## 2020-09-29 ENCOUNTER — Emergency Department
Admission: EM | Admit: 2020-09-29 | Discharge: 2020-09-29 | Disposition: A | Discharge status: Home / Self Care | Payer: BC Managed Care – PPO | Attending: Emergency Medicine | Admitting: Emergency Medicine

## 2020-09-29 DIAGNOSIS — R1033 Periumbilical pain: Secondary | ICD-10-CM | POA: Diagnosis present

## 2020-09-29 DIAGNOSIS — R55 Syncope and collapse: Secondary | ICD-10-CM

## 2020-09-29 DIAGNOSIS — Z79899 Other long term (current) drug therapy: Secondary | ICD-10-CM | POA: Diagnosis not present

## 2020-09-29 DIAGNOSIS — E038 Other specified hypothyroidism: Secondary | ICD-10-CM | POA: Insufficient documentation

## 2020-09-29 DIAGNOSIS — E063 Autoimmune thyroiditis: Secondary | ICD-10-CM

## 2020-09-29 DIAGNOSIS — G8929 Other chronic pain: Secondary | ICD-10-CM

## 2020-09-29 LAB — HEPATIC FUNCTION PANEL
ALT: 22 U/L (ref 0–44)
AST: 20 U/L (ref 15–41)
Albumin: 3.9 g/dL (ref 3.5–5.0)
Alkaline Phosphatase: 47 U/L (ref 38–126)
Bilirubin, Direct: <0.1 mg/dL (ref 0.0–0.2)
Total Bilirubin: 0.3 mg/dL (ref 0.3–1.2)
Total Protein: 7.4 g/dL (ref 6.5–8.1)

## 2020-09-29 LAB — CBC
HCT: 36.6 % (ref 36.0–46.0)
Hemoglobin: 12.4 g/dL (ref 12.0–15.0)
MCH: 29.1 pg (ref 26.0–34.0)
MCHC: 33.9 g/dL (ref 30.0–36.0)
MCV: 85.9 fL (ref 80.0–100.0)
Platelets: 308 10*3/uL (ref 150–400)
RBC: 4.26 MIL/uL (ref 3.87–5.11)
RDW: 12.3 % (ref 11.5–15.5)
WBC: 13.5 10*3/uL — ABNORMAL HIGH (ref 4.0–10.5)
nRBC: 0 % (ref 0.0–0.2)

## 2020-09-29 LAB — URINALYSIS, COMPLETE (UACMP) WITH MICROSCOPIC
Bilirubin Urine: NEGATIVE
Glucose, UA: NEGATIVE mg/dL
Hgb urine dipstick: NEGATIVE
Ketones, ur: NEGATIVE mg/dL
Leukocytes,Ua: NEGATIVE
Nitrite: NEGATIVE
Protein, ur: NEGATIVE mg/dL
Specific Gravity, Urine: 1.02 (ref 1.005–1.030)
pH: 5 (ref 5.0–8.0)

## 2020-09-29 LAB — BASIC METABOLIC PANEL
Anion gap: 3 — ABNORMAL LOW (ref 5–15)
BUN: 13 mg/dL (ref 6–20)
CO2: 23 mmol/L (ref 22–32)
Calcium: 8.8 mg/dL — ABNORMAL LOW (ref 8.9–10.3)
Chloride: 111 mmol/L (ref 98–111)
Creatinine, Ser: 0.8 mg/dL (ref 0.44–1.00)
GFR, Estimated: >60 mL/min (ref >60)
Glucose, Bld: 95 mg/dL (ref 70–99)
Potassium: 3.3 mmol/L — ABNORMAL LOW (ref 3.5–5.1)
Sodium: 137 mmol/L (ref 135–145)

## 2020-09-29 LAB — TSH: TSH: 27.277 u[IU]/mL — ABNORMAL HIGH (ref 0.350–4.500)

## 2020-09-29 LAB — POC URINE PREG, ED: Preg Test, Ur: NEGATIVE

## 2020-09-29 LAB — LIPASE, BLOOD: Lipase: 32 U/L (ref 11–51)

## 2020-09-29 MED ORDER — ONDANSETRON 4 MG PO TBDP: 4.0000 mg | ORAL_TABLET | Freq: Three times a day (TID) | ORAL | 0 refills | Status: AC | PRN

## 2020-09-29 MED ORDER — ALUM & MAG HYDROXIDE-SIMETH 200-200-20 MG/5ML PO SUSP
15.0000 mL | Freq: Once | ORAL | Status: AC
  Administered 2020-09-29: 15 mL via ORAL
  Filled 2020-09-29: qty 30

## 2020-09-29 MED ORDER — LEVOTHYROXINE SODIUM 100 MCG PO TABS: 100.0000 ug | ORAL_TABLET | Freq: Every day | ORAL | 1 refills | Status: DC

## 2020-09-29 MED ORDER — ONDANSETRON 4 MG PO TBDP
4.0000 mg | ORAL_TABLET | Freq: Once | ORAL | Status: AC
  Administered 2020-09-29: 4 mg via ORAL
  Filled 2020-09-29: qty 1

## 2020-09-29 MED ORDER — LIDOCAINE VISCOUS HCL 2 % MT SOLN
15.0000 mL | Freq: Once | OROMUCOSAL | Status: AC
  Administered 2020-09-29: 15 mL via ORAL
  Filled 2020-09-29: qty 15

## 2020-09-29 NOTE — ED Notes (Signed)
Pt assisted to restroom, unsteady gait.

## 2020-09-29 NOTE — ED Provider Notes (Signed)
[facility] Emergency Department Provider Note   ____________________________________________   Event Date/Time   First MD Initiated Contact with Patient 09/29/20 [phone]     (approximate)  I have reviewed the triage vital signs and the nursing notes.   HISTORY  Chief Complaint Loss of Consciousness    HPI Susan Velazquez is a 25 y.o. female with past medical history of Hashimoto's, anxiety, and PTSD who presents to the ED complaining of syncope.  Patient reports that last night she woke up in her apartment surrounded by EMS.  She was told that she had called 911 and then hung up, but she states she does not remember this.  She does state that she has been having issues with frequently passing out, but no friends or family have witnessed seizure activity.  She states that last night she had urinated on herself but she denies any tongue biting.  She denies any history of seizures and has never seen a neurologist.  Episodes have been occurring for multiple weeks and she has seen her PCP for this, but she has not yet seen a neurologist.  She additionally complains of intermittent pain for least the past few months.  Pain is sharp and located in the periumbilical area, exacerbated by eating and by walking.  She describes associated nausea but has not vomited and denies any changes in her bowel movements.  She reports prior endoscopy at [facility], when she was told she had gastritis.        Past Medical History:  Diagnosis Date   Anxiety    Depression    Hashimoto's disease     There are no problems to display for this patient.   Past Surgical History:  Procedure Laterality Date   KNEE SURGERY      Prior to Admission medications   Medication Sig Start Date End Date Taking? Authorizing Provider  levothyroxine (SYNTHROID) 100 MCG tablet Take 1 tablet (100 mcg total) by mouth daily. 09/29/20 11/28/20 Yes Chesley Noon, MD  ondansetron (ZOFRAN ODT) 4 MG  disintegrating tablet Take 1 tablet (4 mg total) by mouth every 8 (eight) hours as needed for nausea or vomiting. 09/29/20  Yes Chesley Noon, MD  dicyclomine (BENTYL) 10 MG capsule Take 1 capsule (10 mg total) by mouth 4 (four) times daily for 14 days. 08/26/19 09/09/19  Triplett, Kasandra Knudsen, FNP  albuterol (VENTOLIN HFA) 108 (90 Base) MCG/ACT inhaler Inhale 2 puffs into the lungs every 6 (six) hours as needed for wheezing or shortness of breath.  01/31/20  [provider]  buPROPion (WELLBUTRIN XL) 150 MG 24 hr tablet Take 150 mg by mouth daily.  01/31/20  [provider]  gabapentin (NEURONTIN) 300 MG capsule Take 300 mg by mouth at bedtime.  01/31/20  [provider]    Allergies Cymbalta [duloxetine hcl] and Iodine  No family history on file.  Social History Social History   Tobacco Use   Smoking status: Never   Smokeless tobacco: Never  Vaping Use   Vaping Use: Never used  Substance Use Topics   Alcohol use: Never   Drug use: Never    Review of Systems  Constitutional: No fever/chills Eyes: No visual changes. ENT: No sore throat. Cardiovascular: Denies chest pain.  Positive for syncope. Respiratory: Denies shortness of breath. Gastrointestinal: Positive for abdominal pain and nausea, no vomiting.  No diarrhea.  No constipation. Genitourinary: Negative for dysuria. Musculoskeletal: Negative for back pain. Skin: Negative for rash. Neurological: Negative for headaches,  focal weakness or numbness.  ____________________________________________   PHYSICAL EXAM:  VITAL SIGNS: ED Triage Vitals  Enc Vitals Group     BP 09/29/20 0045 135/89     Pulse Rate 09/29/20 0045 87     Resp 09/29/20 0045 18     Temp 09/29/20 0045 98.5 F (36.9 C)     Temp Source 09/29/20 0045 Oral     SpO2 09/29/20 0045 99 %     Weight 09/29/20 0043 200 lb (90.7 kg)     Height 09/29/20 0043 5\' 3"  (1.6 m)     Head Circumference --      Peak Flow --      Pain Score 09/29/20  0043 4     Pain Loc --      Pain Edu? --      Excl. in GC? --     Constitutional: Alert and oriented. Eyes: Conjunctivae are normal. Head: Atraumatic. Nose: No congestion/rhinnorhea. Mouth/Throat: Mucous membranes are moist. Neck: Normal ROM Cardiovascular: Normal rate, regular rhythm. Grossly normal heart sounds.  2+ radial pulses bilaterally. Respiratory: Normal respiratory effort.  No retractions. Lungs CTAB. Gastrointestinal: Soft and nontender. No distention. Genitourinary: deferred Musculoskeletal: No lower extremity tenderness nor edema. Neurologic:  Normal speech and language. No gross focal neurologic deficits are appreciated. Skin:  Skin is warm, dry and intact. No rash noted. Psychiatric: Mood and affect are normal. Speech and behavior are normal.  ____________________________________________   LABS (all labs ordered are listed, but only abnormal results are displayed)  Labs Reviewed  BASIC METABOLIC PANEL - Abnormal; Notable for the following components:      Result Value   Potassium 3.3 (*)    Calcium 8.8 (*)    Anion gap 3 (*)    All other components within normal limits  CBC - Abnormal; Notable for the following components:   WBC 13.5 (*)    All other components within normal limits  URINALYSIS, COMPLETE (UACMP) WITH MICROSCOPIC - Abnormal; Notable for the following components:   Color, Urine YELLOW (*)    APPearance CLEAR (*)    Bacteria, UA RARE (*)    All other components within normal limits  TSH - Abnormal; Notable for the following components:   TSH 27.277 (*)    All other components within normal limits  HEPATIC FUNCTION PANEL  LIPASE, BLOOD  POC URINE PREG, ED  CBG MONITORING, ED   ____________________________________________  EKG  ED ECG REPORT I, 11/30/20, the attending physician, personally viewed and interpreted this ECG.   Date: 09/29/2020  EKG Time: 00:50  Rate: 81  Rhythm: normal sinus rhythm  Axis: Normal   Intervals:none  ST&T Change: None   PROCEDURES  Procedure(s) performed (including Critical Care):  Procedures   ____________________________________________   INITIAL IMPRESSION / ASSESSMENT AND PLAN / ED COURSE      25 year old female with past medical history of Hashimoto's, anxiety, and PTSD who presents to the ED following syncopal episode last night where she does not remember calling EMS and reports associated urinary incontinence.  She states she has had frequent similar episodes recently, was seen in the [facility] regional ED yesterday with negative head CT and unrevealing work-up.  She was provided referral to neurology but has not yet scheduled this.  Low suspicion for cardiac etiology for her syncope, EKG shows no evidence of arrhythmia or ischemia and she has no apparent risk factors.  Initial labs are unremarkable, pregnancy testing is negative.  Patient would benefit from neurology follow-up for  EEG but there is no indication for anticonvulsant medication at this time.  She additionally complains of acute on chronic abdominal pain, had abdominal CT and right upper quadrant ultrasound performed at [facility] yesterday that were also unrevealing.  We will add on LFTs and lipase today, treat with Zofran and GI cocktail.  We will also check TSH given her history.  LFTs and lipase are unremarkable, patient's abdominal pain is improved following GI cocktail and Zofran.  TSH is significantly elevated, patient reports being compliant with 75 mcg of levothyroxine daily.  She has been on this dose for 3 months and we will increase it to 100 mcg daily.  She was counseled to follow-up with her PCP for recheck of her TSH.  She was also provided with referral to neurology for further seizure work-up.  She was counseled to return to the ED for new worsening symptoms, patient agrees with plan.      ____________________________________________   FINAL CLINICAL IMPRESSION(S) / ED DIAGNOSES  Final  diagnoses:  Hypothyroidism due to Hashimoto's thyroiditis  Syncope and collapse  Chronic abdominal pain     ED Discharge Orders          Ordered    ondansetron (ZOFRAN ODT) 4 MG disintegrating tablet  Every 8 hours PRN        09/29/20 1041    levothyroxine (SYNTHROID) 100 MCG tablet  Daily        09/29/20 1041             Note:  This document was prepared using Dragon voice recognition software and may include unintentional dictation errors.    Chesley Noon, MD 09/29/20 [phone]

## 2020-09-29 NOTE — ED Triage Notes (Signed)
Pt states she has "static episodes" in the back left side of brain and causing her to have issues on her right side, no current deficits. Pt had syncopal episode tonight and has had multiple over the past few days with n/v. Pt states she was seen in [facility] ER last night and d/c home to follow up with neurology. Pt states they did a head CT that resulted normal.

## 2021-02-06 ENCOUNTER — Emergency Department
Admission: EM | Admit: 2021-02-06 | Discharge: 2021-02-06 | Disposition: A | Discharge status: Home / Self Care | Payer: BC Managed Care – PPO | Attending: Emergency Medicine | Admitting: Emergency Medicine

## 2021-02-06 ENCOUNTER — Other Ambulatory Visit: Payer: Self-pay

## 2021-02-06 DIAGNOSIS — F418 Other specified anxiety disorders: Secondary | ICD-10-CM | POA: Insufficient documentation

## 2021-02-06 DIAGNOSIS — Z20822 Contact with and (suspected) exposure to covid-19: Secondary | ICD-10-CM | POA: Insufficient documentation

## 2021-02-06 DIAGNOSIS — Z79899 Other long term (current) drug therapy: Secondary | ICD-10-CM | POA: Insufficient documentation

## 2021-02-06 DIAGNOSIS — F419 Anxiety disorder, unspecified: Secondary | ICD-10-CM

## 2021-02-06 DIAGNOSIS — F1910 Other psychoactive substance abuse, uncomplicated: Secondary | ICD-10-CM | POA: Insufficient documentation

## 2021-02-06 DIAGNOSIS — Y900 Blood alcohol level of less than 20 mg/100 ml: Secondary | ICD-10-CM | POA: Insufficient documentation

## 2021-02-06 DIAGNOSIS — F32A Depression, unspecified: Secondary | ICD-10-CM

## 2021-02-06 DIAGNOSIS — Z046 Encounter for general psychiatric examination, requested by authority: Secondary | ICD-10-CM | POA: Diagnosis present

## 2021-02-06 LAB — URINE DRUG SCREEN, QUALITATIVE (ARMC ONLY)
Amphetamines, Ur Screen: NOT DETECTED
Barbiturates, Ur Screen: NOT DETECTED
Benzodiazepine, Ur Scrn: NOT DETECTED
Cannabinoid 50 Ng, Ur ~~LOC~~: NOT DETECTED
Cocaine Metabolite,Ur ~~LOC~~: NOT DETECTED
MDMA (Ecstasy)Ur Screen: NOT DETECTED
Methadone Scn, Ur: NOT DETECTED
Opiate, Ur Screen: NOT DETECTED
Phencyclidine (PCP) Ur S: NOT DETECTED
Tricyclic, Ur Screen: NOT DETECTED

## 2021-02-06 LAB — CBC
HCT: 39.7 % (ref 36.0–46.0)
Hemoglobin: 13.4 g/dL (ref 12.0–15.0)
MCH: 27.9 pg (ref 26.0–34.0)
MCHC: 33.8 g/dL (ref 30.0–36.0)
MCV: 82.5 fL (ref 80.0–100.0)
Platelets: 379 10*3/uL (ref 150–400)
RBC: 4.81 MIL/uL (ref 3.87–5.11)
RDW: 12.5 % (ref 11.5–15.5)
WBC: 11.6 10*3/uL — ABNORMAL HIGH (ref 4.0–10.5)
nRBC: 0 % (ref 0.0–0.2)

## 2021-02-06 LAB — COMPREHENSIVE METABOLIC PANEL
ALT: 17 U/L (ref 0–44)
AST: 17 U/L (ref 15–41)
Albumin: 4.5 g/dL (ref 3.5–5.0)
Alkaline Phosphatase: 71 U/L (ref 38–126)
Anion gap: 8 (ref 5–15)
BUN: 8 mg/dL (ref 6–20)
CO2: 24 mmol/L (ref 22–32)
Calcium: 9.6 mg/dL (ref 8.9–10.3)
Chloride: 106 mmol/L (ref 98–111)
Creatinine, Ser: 0.8 mg/dL (ref 0.44–1.00)
GFR, Estimated: >60 mL/min (ref >60)
Glucose, Bld: 92 mg/dL (ref 70–99)
Potassium: 4 mmol/L (ref 3.5–5.1)
Sodium: 138 mmol/L (ref 135–145)
Total Bilirubin: 1 mg/dL (ref 0.3–1.2)
Total Protein: 8.6 g/dL — ABNORMAL HIGH (ref 6.5–8.1)

## 2021-02-06 LAB — ETHANOL: Alcohol, Ethyl (B): <10 mg/dL (ref <10)

## 2021-02-06 LAB — SALICYLATE LEVEL: Salicylate Lvl: <7 mg/dL — ABNORMAL LOW (ref 7.0–30.0)

## 2021-02-06 LAB — RESP PANEL BY RT-PCR (FLU A&B, COVID) ARPGX2
Influenza A by PCR: NEGATIVE
Influenza B by PCR: NEGATIVE
SARS Coronavirus 2 by RT PCR: NEGATIVE

## 2021-02-06 LAB — POC URINE PREG, ED: Preg Test, Ur: NEGATIVE

## 2021-02-06 LAB — ACETAMINOPHEN LEVEL: Acetaminophen (Tylenol), Serum: <10 ug/mL — ABNORMAL LOW (ref 10–30)

## 2021-02-06 NOTE — ED Notes (Signed)
Report received from [facility]. Patient care assumed. Patient/RN introduction complete. Pt awaiting for police transport back to home.

## 2021-02-06 NOTE — Consult Note (Addendum)
[facility] Face-to-Face Psychiatry Consult   Reason for Consult:  concern for SI Referring Physician:  Paduchowski Patient Identification: Susan Velazquez MRN:  [id number] Principal Diagnosis: Anxiety Diagnosis:  Principal Problem:   Anxiety   Total Time spent with patient: 1 hour  Subjective:   Susan Velazquez is a 25 y.o. female patient admitted with "I did not use the dextromethorphan and BuSpar to try and kill myself.  I used it to try to get high because I was so frustrated.".  HPI: Patient seen and chart reviewed.  Patient has a history of anxiety and depression and substance use.  Patient states that she had been communicating with "Lelon Mast" from [facility] to try to get additional services through them.  Lelon Mast had told patient to arrive at [facility], which she did this morning.  After filling out the paperwork and waiting 45 minutes, patient states that [facility] told her they do not take her insurance.  Patient had worked all night and she became very frustrated and texted Lelon Mast something to the effect that it is not worth it, just forget it, and was very frustrated.  What ever transpired, Lelon Mast believed patient was mixing her prescription drugs and alcohol and may intentionally overdose.  Wellness check was called and patient states that she turned the officers away this morning and went back to sleep for a few hours.  Lelon Mast called patient and patient had slurred speech and sounded "drunk."  Please again called to the patient's home when [place] petitioned for IVC.  Patient was then brought here.  On exam patient is coherent with linear speech.  She admits that she should not have mixed alcohol with her prescription medications and taking the cough medication.  She states that she was just so tired and frustrated.  Patient has seen Dr. Sherlean Foot at [facility] for many years and he prescribes her medications.  She also has a therapist, Phil[facility] at [facility]  [place].  She has an appointment with Misty Stanley tomorrow, 02/07/2021.  She has an appointment with Dr. Daleen Squibb on 02/08/21.  Patient describes added stress over the last 3 weeks, starting a new job on third shift.  She states that even when she is not working she states up all night to try to get into the routine and she finds it difficult to sleep in the day.  She reports variable appetite.  Patient also states that she has spoken with Lelon Mast again today and she had given her some more resources for "[facility]" in [place], who specializes in trauma therapy.  Patient reports good support from her parents.  Patient will be speaking to her employer about changing her schedule.  Writer counseled about the need for protected sleep.  Reports good relationship with her boyfriend lives in [place], but will be driving here tonight and staying with her.  Along with important relationships, patient has protective factors of her dog and stable employment.  Patient denies suicidal ideation, homicidal ideation or auditory or visual hallucinations or paranoia.  She is requesting discharge this evening.    Past Psychiatric History: Patient states that she was adopted from [place] and has reactive attachment disorder,  PTSD, anxiety, depression.  Patient states she has a history of nonsuicidal self-injurious behavior by cutting her arms.  Had a suicide attempt in 2019 via OD and was hospitalized.  Had several hospitalizations during eighth and ninth grade years at school.  Risk to Self:   Risk to Others:   Prior  Inpatient Therapy:   Prior Outpatient Therapy:    Past Medical History:  Past Medical History:  Diagnosis Date   Anxiety    Depression    Hashimoto's disease     Past Surgical History:  Procedure Laterality Date   KNEE SURGERY     Family History: No family history on file. Family Psychiatric  History: Patient is adopted.  She states that she knows both of her biological parents had  alcoholism Social History:  Social History   Substance and Sexual Activity  Alcohol Use Yes     Social History   Substance and Sexual Activity  Drug Use Never    Social History   Socioeconomic History   Marital status: Single    Spouse name: Not on file   Number of children: Not on file   Years of education: Not on file   Highest education level: Not on file  Occupational History   Not on file  Tobacco Use   Smoking status: Never   Smokeless tobacco: Never  Vaping Use   Vaping Use: Never used  Substance and Sexual Activity   Alcohol use: Yes   Drug use: Never   Sexual activity: Not on file  Other Topics Concern   Not on file  Social History Narrative   Not on file   Social Determinants of Health   Financial Resource Strain: Not on file  Food Insecurity: Not on file  Transportation Needs: Not on file  Physical Activity: Not on file  Stress: Not on file  Social Connections: Not on file   Additional Social History:    Allergies:   Allergies  Allergen Reactions   Cymbalta [Duloxetine Hcl] Other (See Comments)    Suicidal ideation   Iodine Other (See Comments)    "affects my thyroid"    Labs:  Results for orders placed or performed during the hospital encounter of 02/06/21 (from the past 48 hour(s))  Urine Drug Screen, Qualitative     Status: None   Collection Time: 02/06/21 12:41 PM  Result Value Ref Range   Tricyclic, Ur Screen NONE DETECTED NONE DETECTED   Amphetamines, Ur Screen NONE DETECTED NONE DETECTED   MDMA (Ecstasy)Ur Screen NONE DETECTED NONE DETECTED   Cocaine Metabolite,Ur [place] NONE DETECTED NONE DETECTED   Opiate, Ur Screen NONE DETECTED NONE DETECTED   Phencyclidine (PCP) Ur S NONE DETECTED NONE DETECTED   Cannabinoid 50 Ng, Ur  NONE DETECTED NONE DETECTED   Barbiturates, Ur Screen NONE DETECTED NONE DETECTED   Benzodiazepine, Ur Scrn NONE DETECTED NONE DETECTED   Methadone Scn, Ur NONE DETECTED NONE DETECTED    Comment:  (NOTE) Tricyclics + metabolites, urine    Cutoff 1000 ng/mL Amphetamines + metabolites, urine  Cutoff 1000 ng/mL MDMA (Ecstasy), urine              Cutoff 500 ng/mL Cocaine Metabolite, urine          Cutoff 300 ng/mL Opiate + metabolites, urine        Cutoff 300 ng/mL Phencyclidine (PCP), urine         Cutoff 25 ng/mL Cannabinoid, urine                 Cutoff 50 ng/mL Barbiturates + metabolites, urine  Cutoff 200 ng/mL Benzodiazepine, urine              Cutoff 200 ng/mL Methadone, urine  Cutoff 300 ng/mL  The urine drug screen provides only a preliminary, unconfirmed analytical test result and should not be used for non-medical purposes. Clinical consideration and professional judgment should be applied to any positive drug screen result due to possible interfering substances. A more specific alternate chemical method must be used in order to obtain a confirmed analytical result. Gas chromatography / mass spectrometry (GC/MS) is the preferred confirm atory method. Performed at [facility], [address] [place], [place] [address]   Comprehensive metabolic panel     Status: Abnormal   Collection Time: 02/06/21 12:43 PM  Result Value Ref Range   Sodium 138 135 - 145 mmol/L   Potassium 4.0 3.5 - 5.1 mmol/L   Chloride 106 98 - 111 mmol/L   CO2 24 22 - 32 mmol/L   Glucose, Bld 92 70 - 99 mg/dL    Comment: Glucose reference range applies only to samples taken after fasting for at least 8 hours.   BUN 8 6 - 20 mg/dL   Creatinine, Ser 7.51 0.44 - 1.00 mg/dL   Calcium 9.6 8.9 - 70.0 mg/dL   Total Protein 8.6 (H) 6.5 - 8.1 g/dL   Albumin 4.5 3.5 - 5.0 g/dL   AST 17 15 - 41 U/L   ALT 17 0 - 44 U/L   Alkaline Phosphatase 71 38 - 126 U/L   Total Bilirubin 1.0 0.3 - 1.2 mg/dL   GFR, Estimated >17 >49 mL/min    Comment: (NOTE) Calculated using the CKD-EPI Creatinine Equation (2021)    Anion gap 8 5 - 15    Comment: Performed at [facility], [address], [place], [place] [address]  Ethanol     Status: None   Collection Time: 02/06/21 12:43 PM  Result Value Ref Range   Alcohol, Ethyl (B) <10 <10 mg/dL    Comment: (NOTE) Lowest detectable limit for serum alcohol is 10 mg/dL.  For medical purposes only. Performed at [facility], [address] [place], [place] [address]   Salicylate level     Status: Abnormal   Collection Time: 02/06/21 12:43 PM  Result Value Ref Range   Salicylate Lvl <7.0 (L) 7.0 - 30.0 mg/dL    Comment: Performed at [facility], [address] [place], [place] [address]  Acetaminophen level     Status: Abnormal   Collection Time: 02/06/21 12:43 PM  Result Value Ref Range   Acetaminophen (Tylenol), Serum <10 (L) 10 - 30 ug/mL    Comment: (NOTE) Therapeutic concentrations vary significantly. A range of 10-30 ug/mL  may be an effective concentration for many patients. However, some  are best treated at concentrations outside of this range. Acetaminophen concentrations >150 ug/mL at 4 hours after ingestion  and >50 ug/mL at 12 hours after ingestion are often associated with  toxic reactions.  Performed at [facility], [address] [place], [place] [address]   cbc     Status: Abnormal   Collection Time: 02/06/21 12:43 PM  Result Value Ref Range   WBC 11.6 (H) 4.0 - 10.5 K/uL   RBC 4.81 3.87 - 5.11 MIL/uL   Hemoglobin 13.4 12.0 - 15.0 g/dL   HCT 01.7 79.3 - 90.3 %   MCV 82.5 80.0 - 100.0 fL   MCH 27.9 26.0 - 34.0 pg   MCHC 33.8 30.0 - 36.0 g/dL   RDW 00.9 23.3 - 00.7 %   Platelets 379 150 - 400 K/uL   nRBC 0.0 0.0 -  0.2 %    Comment: Performed at [facility], [address] [place], [place] [address]  POC urine preg, ED     Status: None   Collection Time: 02/06/21  2:22 PM  Result Value Ref Range   Preg Test, Ur NEGATIVE NEGATIVE    Comment:        THE SENSITIVITY OF THIS METHODOLOGY IS >24 mIU/mL   Resp Panel by RT-PCR (Flu A&B, Covid)  Nasopharyngeal Swab     Status: None   Collection Time: 02/06/21  2:26 PM   Specimen: Nasopharyngeal Swab; Nasopharyngeal(NP) swabs in vial transport medium  Result Value Ref Range   SARS Coronavirus 2 by RT PCR NEGATIVE NEGATIVE    Comment: (NOTE) SARS-CoV-2 target nucleic acids are NOT DETECTED.  The SARS-CoV-2 RNA is generally detectable in upper respiratory specimens during the acute phase of infection. The lowest concentration of SARS-CoV-2 viral copies this assay can detect is 138 copies/mL. A negative result does not preclude SARS-Cov-2 infection and should not be used as the sole basis for treatment or other patient management decisions. A negative result may occur with  improper specimen collection/handling, submission of specimen other than nasopharyngeal swab, presence of viral mutation(s) within the areas targeted by this assay, and inadequate number of viral copies(<138 copies/mL). A negative result must be combined with clinical observations, patient history, and epidemiological information. The expected result is Negative.  Fact Sheet for Patients:  BloggerCourse.com  Fact Sheet for Healthcare Providers:  SeriousBroker.it  This test is no t yet approved or cleared by the [place] FDA and  has been authorized for detection and/or diagnosis of SARS-CoV-2 by FDA under an Emergency Use Authorization (EUA). This EUA will remain  in effect (meaning this test can be used) for the duration of the COVID-19 declaration under Section 564(b)(1) of the Act, 21 U.S.C.section 360bbb-3(b)(1), unless the authorization is terminated  or revoked sooner.       Influenza A by PCR NEGATIVE NEGATIVE   Influenza B by PCR NEGATIVE NEGATIVE    Comment: (NOTE) The Xpert Xpress SARS-CoV-2/FLU/RSV plus assay is intended as an aid in the diagnosis of influenza from Nasopharyngeal swab specimens and should not be used as a sole basis for  treatment. Nasal washings and aspirates are unacceptable for Xpert Xpress SARS-CoV-2/FLU/RSV testing.  Fact Sheet for Patients: BloggerCourse.com  Fact Sheet for Healthcare Providers: SeriousBroker.it  This test is not yet approved or cleared by the [place] FDA and has been authorized for detection and/or diagnosis of SARS-CoV-2 by FDA under an Emergency Use Authorization (EUA). This EUA will remain in effect (meaning this test can be used) for the duration of the COVID-19 declaration under Section 564(b)(1) of the Act, 21 U.S.C. section 360bbb-3(b)(1), unless the authorization is terminated or revoked.  Performed at [facility], [address] [place], [place] [address]     No current facility-administered medications for this encounter.   Current Outpatient Medications  Medication Sig Dispense Refill   dicyclomine (BENTYL) 10 MG capsule Take 1 capsule (10 mg total) by mouth 4 (four) times daily for 14 days. 56 capsule 0   levothyroxine (SYNTHROID) 100 MCG tablet Take 1 tablet (100 mcg total) by mouth daily. 30 tablet 1   ondansetron (ZOFRAN ODT) 4 MG disintegrating tablet Take 1 tablet (4 mg total) by mouth every 8 (eight) hours as needed for nausea or vomiting. 12 tablet 0    Musculoskeletal: Strength & Muscle Tone: within normal limits Gait & Station: normal  Patient leans: N/A  Psychiatric Specialty Exam:  Presentation  General Appearance: Appropriate for Environment Eye Contact:Good Speech:Clear and Coherent Speech Volume:Normal Handedness:No data recorded  Mood and Affect  Mood:Euthymic; Anxious Affect:Appropriate  Thought Process  Thought Processes:Coherent Descriptions of Associations:Intact Orientation:Full (Time, Place and Person) Thought Content:Logical History of Schizophrenia/Schizoaffective disorder:No data recorded Duration of Psychotic Symptoms:No data  recorded Hallucinations:Hallucinations: None Ideas of Reference:None Suicidal Thoughts:Suicidal Thoughts: No Homicidal Thoughts:Homicidal Thoughts: No  Sensorium  Memory:Immediate Good; Recent Good Judgment:Fair Insight:Good  Executive Functions  Concentration:No data recorded Attention Span:Good Recall:Good Fund of Knowledge:Good Language:Good  Psychomotor Activity  Psychomotor Activity:Psychomotor Activity: Normal  Assets  Assets:Financial Resources/Insurance; Desire for Improvement; Physical Health; Social Support; Resilience; Housing  Sleep  Sleep:Sleep: Fair  Physical Exam: Physical Exam Vitals and nursing note reviewed.  HENT:     Head: Normocephalic.     Nose: No congestion or rhinorrhea.  Eyes:     General:        Right eye: No discharge.        Left eye: No discharge.  Cardiovascular:     Rate and Rhythm: Normal rate.  Pulmonary:     Effort: Pulmonary effort is normal.  Musculoskeletal:        General: Normal range of motion.     Cervical back: Normal range of motion.  Skin:    General: Skin is dry.  Neurological:     Mental Status: She is alert and oriented to person, place, and time.  Psychiatric:        Attention and Perception: Attention normal.        Mood and Affect: Mood normal.        Speech: Speech normal.        Behavior: Behavior normal.        Thought Content: Thought content normal.        Judgment: Judgment is impulsive.   Review of Systems  Psychiatric/Behavioral:  Positive for depression (chronic, stable). Negative for hallucinations, memory loss, substance abuse and suicidal ideas. The patient is nervous/anxious (chronic, stable). The patient does not have insomnia.   All other systems reviewed and are negative. Blood pressure 137/85, pulse 82, temperature 98 F (36.7 C), temperature source Oral, resp. rate 20, height 5\' 3"  (1.6 m), weight 81.6 kg, SpO2 100 %. Body mass index is 31.89 kg/m.  Treatment Plan Summary: Plan patient  is a 25 year old female with past history of PTSD, anxiety, depression who presented to the ED via law enforcement under IVC taken out by employee of [employer] due to concern of possible suicidal thoughts and taking alcohol with prescription medication and cough syrup.  Patient has a psychotherapy appointment tomorrow as well as appointment with psychiatrist the following day.  She has protective factors of intimate relationship, supportive family, dog, employment.  Patient is requesting discharge to home.  She is denying any thoughts of self-harm.  No thoughts or intent of suicide.  Recommend discharge to home with outpatient follow-up.Reviewed with Dr. Toni Amend, who is in agreement.  Disposition: No evidence of imminent risk to self or others at present.   Supportive therapy provided about ongoing stressors. Discussed crisis plan, support from social network, calling 911, coming to the Emergency Department, and calling Suicide Hotline.  Vanetta Mulders, NP 02/06/2021 6:06 PM

## 2021-02-06 NOTE — ED Triage Notes (Addendum)
Pt to ED IVC [employer] for psych eval. Friend took out papers after receiving text that she was having panic attack and was self medicating with pills and ETOH.  Denies SI/HI.  Endorses ETOH use earlier today, states has been taking medicine in cough syrup to get high (3 capsules 60 or 100mg  each) at 0800 States also took buspar but unsure how much (no longer px buspar)  States she was taking these meds and ETOH to get out of reality

## 2021-02-06 NOTE — ED Provider Notes (Signed)
Patient was cleared by psych, IVC rescinded.  Poison control also cleared.  She is stable for discharge.   Georga Hacking, MD 02/06/21 [phone]

## 2021-02-06 NOTE — BH Assessment (Signed)
Comprehensive Clinical Assessment (CCA) Note  02/06/2021 Susan Velazquez [id number]  Susan Velazquez, 25 year old female who presents to [facility] ED involuntarily for treatment. Per triage note, Pt to ED IVC BPD for psych eval. Friend took out papers after receiving text that she was having panic attack and was self-medicating with pills and ETOH. Denies SI/HI. Endorses ETOH use earlier today, states has been taking medicine in cough syrup to get high (3 capsules 60 or 100mg  each) at 0800. States also took Buspar but unsure how much (no longer px Buspar). States she was taking these meds and ETOH to get out of reality.    During TTS assessment pt presents alert and oriented x 4, restless but cooperative, and mood-congruent with affect. The pt does not appear to be responding to internal or external stimuli. Neither is the pt presenting with any delusional thinking. Pt verified the information provided to triage RN.   Pt identifies her main complaint to be that she was upset and acted out of frustration. Patient reports she was already having a bad and just finished working a 12hr shift. Patient states she was trying to get services through [facility] and because she has insurance she later found out that she was denied services. Patient reports she was upset and although she cannot remember exactly what she said, patient does remember having an anxiety attack, drinking a beer and taking her prescription pills so she could get high. Patient reports she was not trying to kill herself but wanted to get high to relieve some stress. Patient reports talking to rep([name] at [facility] who eventually called the police and brought patient to the ED. Patient reports drinking and smoking marijuana occasionally but not on a consistent basis. "It's not a problem for me." Patient reports INPT hx years ago when she was in 8th grade and she sees her therapist, Susan Velazquez and psychiatrist, regularly for outpatient services.  I  have an appt on tomm and Friday." Pt denies SI/HI/AH/VH. Patient reports she works 3rd shift and is in the process of getting her shift changed. Patient states she does not get enough sleep and her appetite is "all over the place." Pt contracts for safety. Patient states her boyfriend is supportive and she has a good relationship with her parents.    Per Wednesday, NP, patient does not meet criteria for inpatient psychiatric admission.   Chief Complaint:  Chief Complaint  Patient presents with   ivc   Visit Diagnosis: Past hx of depression, anxiety    CCA Screening, Triage and Referral (STR)  Patient Reported Information How did you hear about Susan Velazquez? -- [place])  Referral name: No data recorded Referral phone number: No data recorded  Whom do you see for routine medical problems? No data recorded Practice/Facility Name: No data recorded Practice/Facility Phone Number: No data recorded Name of Contact: No data recorded Contact Number: No data recorded Contact Fax Number: No data recorded Prescriber Name: No data recorded Prescriber Address (if known): No data recorded  What Is the Reason for Your Visit/Call Today? Patient reports she was brought to the ED because of a misunderstanding. Patient reports she is not suicidal and just had a bad day.  How Long Has This Been Causing You Problems? <Week  What Do You Feel Would Help You the Most Today? -- (Assessment only)   Have You Recently Been in Any Inpatient Treatment (Hospital/Detox/Crisis Center/28-Day Program)? No data recorded Name/Location of Program/Hospital:No data recorded How Long Were You There?  No data recorded When Were You Discharged? No data recorded  Have You Ever Received Services From [facility] Before? No data recorded Who Do You See at [facility]? No data recorded  Have You Recently Had Any Thoughts About Hurting Yourself? No  Are You Planning to Commit Suicide/Harm Yourself At This time? No   Have  you Recently Had Thoughts About Hurting Someone Susan Velazquez? No  Explanation: No data recorded  Have You Used Any Alcohol or Drugs in the Past 24 Hours? Yes  How Long Ago Did You Use Drugs or Alcohol? No data recorded What Did You Use and How Much? Alcohol   Do You Currently Have a Therapist/Psychiatrist? Yes  Name of Therapist/Psychiatrist: Philis Velazquez Dr. Sherlean Velazquez   Have You Been Recently Discharged From Any Office Practice or Programs? No  Explanation of Discharge From Practice/Program: No data recorded    CCA Screening Triage Referral Assessment Type of Contact: Face-to-Face  Is this Initial or Reassessment? No data recorded Date Telepsych consult ordered in [facility]:  No data recorded Time Telepsych consult ordered in [facility]:  No data recorded  Patient Reported Information Reviewed? No data recorded Patient Left Without Being Seen? No data recorded Reason for Not Completing Assessment: No data recorded  Collateral Involvement: None provided   Does Patient Have a Court Appointed Legal Guardian? No data recorded Name and Contact of Legal Guardian: No data recorded If Minor and Not Living with Parent(s), Who has Custody? n/a  Is CPS involved or ever been involved? Never  Is APS involved or ever been involved? Never   Patient Determined To Be At Risk for Harm To Self or Others Based on Review of Patient Reported Information or Presenting Complaint? No  Method: No data recorded Availability of Means: No data recorded Intent: No data recorded Notification Required: No data recorded Additional Information for Danger to Others Potential: No data recorded Additional Comments for Danger to Others Potential: No data recorded Are There Guns or Other Weapons in Your Home? No data recorded Types of Guns/Weapons: No data recorded Are These Weapons Safely Secured?                            No data recorded Who Could Verify You Are Able To Have These Secured: No data recorded Do You  Have any Outstanding Charges, Pending Court Dates, Parole/Probation? No data recorded Contacted To Inform of Risk of Harm To Self or Others: No data recorded  Location of Assessment: [facility]   Does Patient Present under Involuntary Commitment? Yes  IVC Papers Initial File Date: 02/06/21   [place] of Residence: [place]   Patient Currently Receiving the Following Services: Individual Therapy; Medication Management   Determination of Need: Urgent (48 hours)   Options For Referral: ED Visit; Medication Management; Outpatient Therapy  Recommendations for Services/Supports/Treatments:    DSM5 Diagnoses: There are no problems to display for this patient.   Patient Centered Plan: Patient is on the following Treatment Plan(s):  Anxiety and Depression   Referrals to Alternative Service(s): Referred to Alternative Service(s):   Place:   Date:   Time:    Referred to Alternative Service(s):   Place:   Date:   Time:    Referred to Alternative Service(s):   Place:   Date:   Time:    Referred to Alternative Service(s):   Place:   Date:   Time:     Lanee Chain Dierdre Searles, Counselor, LCAS-A

## 2021-02-06 NOTE — ED Provider Notes (Signed)
[facility] Emergency Department Provider Note  Time seen: 2:58 PM  I have reviewed the triage vital signs and the nursing notes.   HISTORY  Chief Complaint ivc   HPI Susan Velazquez is a 25 y.o. female with a past medical history of anxiety, depression, presents emergency department for depression, substance use.  According to the patient she has been having worsening depression, this morning states she drank alcohol and took several BuSpar as well as dextromethorphan.  Patient states she was taking it to get high and not to kill her self.  However patient does admit she has had to call the crisis line several times this week due to worsening depression.  Patient was brought in under IVC for further evaluation.   Past Medical History:  Diagnosis Date   Anxiety    Depression    Hashimoto's disease     There are no problems to display for this patient.   Past Surgical History:  Procedure Laterality Date   KNEE SURGERY      Prior to Admission medications   Medication Sig Start Date End Date Taking? Authorizing Provider  dicyclomine (BENTYL) 10 MG capsule Take 1 capsule (10 mg total) by mouth 4 (four) times daily for 14 days. 08/26/19 09/09/19  Triplett, Rulon Eisenmenger B, FNP  levothyroxine (SYNTHROID) 100 MCG tablet Take 1 tablet (100 mcg total) by mouth daily. 09/29/20 11/28/20  Chesley Noon, MD  ondansetron (ZOFRAN ODT) 4 MG disintegrating tablet Take 1 tablet (4 mg total) by mouth every 8 (eight) hours as needed for nausea or vomiting. 09/29/20   Chesley Noon, MD  albuterol (VENTOLIN HFA) 108 (90 Base) MCG/ACT inhaler Inhale 2 puffs into the lungs every 6 (six) hours as needed for wheezing or shortness of breath.  01/31/20  [provider]  buPROPion (WELLBUTRIN XL) 150 MG 24 hr tablet Take 150 mg by mouth daily.  01/31/20  [provider]  gabapentin (NEURONTIN) 300 MG capsule Take 300 mg by mouth at bedtime.  01/31/20  [provider]     Allergies  Allergen Reactions   Cymbalta [Duloxetine Hcl] Other (See Comments)    Suicidal ideation   Iodine Other (See Comments)    "affects my thyroid"    No family history on file.  Social History Social History   Tobacco Use   Smoking status: Never   Smokeless tobacco: Never  Vaping Use   Vaping Use: Never used  Substance Use Topics   Alcohol use: Yes   Drug use: Never    Review of Systems Constitutional: Negative for fever. Cardiovascular: Negative for chest pain. Respiratory: Negative for shortness of breath. Gastrointestinal: Negative for abdominal pain, vomiting Musculoskeletal: Negative for musculoskeletal complaints Neurological: Negative for headache All other ROS negative  ____________________________________________   PHYSICAL EXAM:  VITAL SIGNS: ED Triage Vitals  Enc Vitals Group     BP 02/06/21 1235 137/85     Pulse Rate 02/06/21 1235 82     Resp 02/06/21 1235 20     Temp 02/06/21 1235 98 F (36.7 C)     Temp Source 02/06/21 1235 Oral     SpO2 02/06/21 1235 100 %     Weight 02/06/21 1239 180 lb (81.6 kg)     Height 02/06/21 1239 5\' 3"  (1.6 m)     Head Circumference --      Peak Flow --      Pain Score 02/06/21 1239 0     Pain Loc --  Pain Edu? --      Excl. in GC? --     Constitutional: Alert and oriented. Well appearing and in no distress. Eyes: Normal exam ENT      Head: Normocephalic and atraumatic.      Mouth/Throat: Mucous membranes are moist. Cardiovascular: Normal rate, regular rhythm.  Respiratory: Normal respiratory effort without tachypnea nor retractions. Breath sounds are clear  Gastrointestinal: Soft and nontender. No distention.   Musculoskeletal: Nontender with normal range of motion in all extremities. Neurologic:  Normal speech and language. No gross focal neurologic deficits are appreciated. Skin:  Skin is warm, dry and intact.  Psychiatric: Mood and affect are normal.  Denies  SI.  ____________________________________________    EKG  EKG viewed and interpreted by myself shows a normal sinus rhythm 82 bpm with a narrow QRS, normal axis, normal intervals, no concerning ST changes.  ____________________________________________   INITIAL IMPRESSION / ASSESSMENT AND PLAN / ED COURSE  Pertinent labs & imaging results that were available during my care of the patient were reviewed by me and considered in my medical decision making (see chart for details).   Patient presents emergency department for depression, substance abuse brought in under IVC for concerns for possible suicidal ideation.  Patient does admit to drinking alcohol, using dextromethorphan as well as BuSpar, but states she was using it to get high and not to kill her self.  Patient however does admit she is called the crisis line several times this week for worsening depression.  Lab work is largely nonrevealing.  We will continue to closely monitor.  EKG is reassuring.  Urine is pending.  We will have psychiatry evaluate and we will maintain the IVC until she be adequately evaluated by psychiatry.  Susan Velazquez was evaluated in Emergency Department on 02/06/2021 for the symptoms described in the history of present illness. She was evaluated in the context of the global COVID-19 pandemic, which necessitated consideration that the patient might be at risk for infection with the SARS-CoV-2 virus that causes COVID-19. Institutional protocols and algorithms that pertain to the evaluation of patients at risk for COVID-19 are in a state of rapid change based on information released by regulatory bodies including the CDC and federal and state organizations. These policies and algorithms were followed during the patient's care in the ED.  ____________________________________________   FINAL CLINICAL IMPRESSION(S) / ED DIAGNOSES  Depression Substance abuse   Minna Antis, MD 02/06/21 1501

## 2021-02-06 NOTE — ED Notes (Signed)
IVC PENDING  CONSULT ?

## 2021-02-06 NOTE — ED Notes (Signed)
VOLUNTARY AS IVC RESCINDED BY NP BARTHOLD

## 2021-02-06 NOTE — ED Notes (Addendum)
Pt belongings: Cell phone Wallet Keys Blue tennis shoes Blue shorts Blue sweatshirt Black shirt Sports bra socks Hair tie earrings

## 2021-02-06 NOTE — ED Notes (Addendum)
This RN contacted poison control. Spoke with Verlon Au.  EKG, tylenol level, cmp, supportive care (fluids and benzos if needed)

## 2021-02-06 NOTE — ED Notes (Signed)
Cleared by poison control at this time. 

## 2021-02-22 ENCOUNTER — Other Ambulatory Visit: Payer: Self-pay

## 2021-02-22 ENCOUNTER — Emergency Department
Admission: EM | Admit: 2021-02-22 | Discharge: 2021-02-25 | Disposition: A | Discharge status: Home / Self Care | Payer: BC Managed Care – PPO | Attending: Emergency Medicine | Admitting: Emergency Medicine

## 2021-02-22 DIAGNOSIS — T4271XA Poisoning by unspecified antiepileptic and sedative-hypnotic drugs, accidental (unintentional), initial encounter: Secondary | ICD-10-CM | POA: Diagnosis present

## 2021-02-22 DIAGNOSIS — T481X1A Poisoning by skeletal muscle relaxants [neuromuscular blocking agents], accidental (unintentional), initial encounter: Secondary | ICD-10-CM | POA: Diagnosis not present

## 2021-02-22 DIAGNOSIS — T402X1A Poisoning by other opioids, accidental (unintentional), initial encounter: Secondary | ICD-10-CM | POA: Diagnosis not present

## 2021-02-22 DIAGNOSIS — J101 Influenza due to other identified influenza virus with other respiratory manifestations: Secondary | ICD-10-CM

## 2021-02-22 DIAGNOSIS — Z20822 Contact with and (suspected) exposure to covid-19: Secondary | ICD-10-CM | POA: Insufficient documentation

## 2021-02-22 DIAGNOSIS — F332 Major depressive disorder, recurrent severe without psychotic features: Secondary | ICD-10-CM | POA: Diagnosis not present

## 2021-02-22 DIAGNOSIS — T50904A Poisoning by unspecified drugs, medicaments and biological substances, undetermined, initial encounter: Secondary | ICD-10-CM

## 2021-02-22 DIAGNOSIS — F419 Anxiety disorder, unspecified: Secondary | ICD-10-CM | POA: Insufficient documentation

## 2021-02-22 LAB — CBC WITH DIFFERENTIAL/PLATELET
Abs Immature Granulocytes: 0.02 10*3/uL (ref 0.00–0.07)
Basophils Absolute: 0 10*3/uL (ref 0.0–0.1)
Basophils Relative: 1 %
Eosinophils Absolute: 0 10*3/uL (ref 0.0–0.5)
Eosinophils Relative: 1 %
HCT: 40 % (ref 36.0–46.0)
Hemoglobin: 13.2 g/dL (ref 12.0–15.0)
Immature Granulocytes: 0 %
Lymphocytes Relative: 23 %
Lymphs Abs: 1.5 10*3/uL (ref 0.7–4.0)
MCH: 27.6 pg (ref 26.0–34.0)
MCHC: 33 g/dL (ref 30.0–36.0)
MCV: 83.7 fL (ref 80.0–100.0)
Monocytes Absolute: 0.7 10*3/uL (ref 0.1–1.0)
Monocytes Relative: 10 %
Neutro Abs: 4.2 10*3/uL (ref 1.7–7.7)
Neutrophils Relative %: 65 %
Platelets: 314 10*3/uL (ref 150–400)
RBC: 4.78 MIL/uL (ref 3.87–5.11)
RDW: 12.3 % (ref 11.5–15.5)
WBC: 6.5 10*3/uL (ref 4.0–10.5)
nRBC: 0 % (ref 0.0–0.2)

## 2021-02-22 LAB — RESP PANEL BY RT-PCR (FLU A&B, COVID) ARPGX2
Influenza A by PCR: POSITIVE — AB
Influenza B by PCR: NEGATIVE
SARS Coronavirus 2 by RT PCR: NEGATIVE

## 2021-02-22 LAB — COMPREHENSIVE METABOLIC PANEL
ALT: 17 U/L (ref 0–44)
AST: 17 U/L (ref 15–41)
Albumin: 4.4 g/dL (ref 3.5–5.0)
Alkaline Phosphatase: 72 U/L (ref 38–126)
Anion gap: 7 (ref 5–15)
BUN: 11 mg/dL (ref 6–20)
CO2: 25 mmol/L (ref 22–32)
Calcium: 9.5 mg/dL (ref 8.9–10.3)
Chloride: 104 mmol/L (ref 98–111)
Creatinine, Ser: 0.79 mg/dL (ref 0.44–1.00)
GFR, Estimated: >60 mL/min (ref >60)
Glucose, Bld: 103 mg/dL — ABNORMAL HIGH (ref 70–99)
Potassium: 4.1 mmol/L (ref 3.5–5.1)
Sodium: 136 mmol/L (ref 135–145)
Total Bilirubin: 0.7 mg/dL (ref 0.3–1.2)
Total Protein: 8.8 g/dL — ABNORMAL HIGH (ref 6.5–8.1)

## 2021-02-22 LAB — URINE DRUG SCREEN, QUALITATIVE (ARMC ONLY)
Amphetamines, Ur Screen: NOT DETECTED
Barbiturates, Ur Screen: NOT DETECTED
Benzodiazepine, Ur Scrn: NOT DETECTED
Cannabinoid 50 Ng, Ur ~~LOC~~: NOT DETECTED
Cocaine Metabolite,Ur ~~LOC~~: NOT DETECTED
MDMA (Ecstasy)Ur Screen: NOT DETECTED
Methadone Scn, Ur: NOT DETECTED
Opiate, Ur Screen: POSITIVE — AB
Phencyclidine (PCP) Ur S: NOT DETECTED
Tricyclic, Ur Screen: NOT DETECTED

## 2021-02-22 LAB — SALICYLATE LEVEL: Salicylate Lvl: <7 mg/dL — ABNORMAL LOW (ref 7.0–30.0)

## 2021-02-22 LAB — ETHANOL: Alcohol, Ethyl (B): <10 mg/dL (ref <10)

## 2021-02-22 LAB — ACETAMINOPHEN LEVEL: Acetaminophen (Tylenol), Serum: <10 ug/mL — ABNORMAL LOW (ref 10–30)

## 2021-02-22 LAB — POC URINE PREG, ED: Preg Test, Ur: NEGATIVE

## 2021-02-22 NOTE — ED Triage Notes (Signed)
Pt came in [facility] due to testing suicide hot line, pt took 2 handfuls or 200 mg gabapentin- tylenol handful, cyclobenzaprine, synthroid, buspirone- xanax- pt has hx of OD attempts.   When speaking to the pt she denied thoughts of SI or HI, pt did state she took gabapentin however police got a different list at her house on the initial call.   Pt dressed out by tech and this nurse

## 2021-02-22 NOTE — ED Provider Notes (Signed)
[facility] Provider Note  ____________________________________________   Event Date/Time   First MD Initiated Contact with Patient 02/22/21 2037     (approximate)  I have reviewed the triage vital signs and the nursing notes.   HISTORY  Chief Complaint Mental Health Problem    HPI Susan Velazquez is a 25 y.o. female with history of depression who reportedly took 2 handfuls of 300 mg of gabapentin, 1 Flexeril and 2 oxycodone with Tylenol.  She states that she was doing it to get high.  She states that she started feeling anxious so she called the support line and they were concerned about how much she took an IVC to her and had police show up at her house.  She denies any other symptoms.  She states that she is taking all the medications to help treat her pain from a foot injury as well as to feel high.  She denies any SI, HI        Past Medical History:  Diagnosis Date   Anxiety    Depression    Hashimoto's disease     Patient Active Problem List   Diagnosis Date Noted   Anxiety 02/06/2021    Past Surgical History:  Procedure Laterality Date   KNEE SURGERY      Prior to Admission medications   Medication Sig Start Date End Date Taking? Authorizing Provider  dicyclomine (BENTYL) 10 MG capsule Take 1 capsule (10 mg total) by mouth 4 (four) times daily for 14 days. 08/26/19 09/09/19  Triplett, Rulon Eisenmenger B, FNP  levothyroxine (SYNTHROID) 100 MCG tablet Take 1 tablet (100 mcg total) by mouth daily. 09/29/20 11/28/20  Chesley Noon, MD  ondansetron (ZOFRAN ODT) 4 MG disintegrating tablet Take 1 tablet (4 mg total) by mouth every 8 (eight) hours as needed for nausea or vomiting. 09/29/20   Chesley Noon, MD  albuterol (VENTOLIN HFA) 108 (90 Base) MCG/ACT inhaler Inhale 2 puffs into the lungs every 6 (six) hours as needed for wheezing or shortness of breath.  01/31/20  [provider]  buPROPion (WELLBUTRIN XL) 150 MG 24 hr  tablet Take 150 mg by mouth daily.  01/31/20  [provider]  gabapentin (NEURONTIN) 300 MG capsule Take 300 mg by mouth at bedtime.  01/31/20  [provider]    Allergies Cymbalta [duloxetine hcl] and Iodine  History reviewed. No pertinent family history.  Social History Social History   Tobacco Use   Smoking status: Never   Smokeless tobacco: Never  Vaping Use   Vaping Use: Never used  Substance Use Topics   Alcohol use: Yes   Drug use: Never      Review of Systems Constitutional: No fever/chills Eyes: No visual changes. ENT: No sore throat. Cardiovascular: Denies chest pain. Respiratory: Denies shortness of breath. Gastrointestinal: No abdominal pain.  No nausea, no vomiting.  No diarrhea.  No constipation. Genitourinary: Negative for dysuria. Musculoskeletal: Negative for back pain. Skin: Negative for rash. Neurological: Negative for headaches, focal weakness or numbness. Psych: Overdose All other ROS negative ____________________________________________   PHYSICAL EXAM:  VITAL SIGNS: ED Triage Vitals [02/22/21 2030]  Enc Vitals Group     BP      Pulse      Resp      Temp      Temp src      SpO2      Weight 176 lb 5.9 oz (80 kg)     Height 5\' 3"  (1.6 m)  Head Circumference      Peak Flow      Pain Score 0     Pain Loc      Pain Edu?      Excl. in GC?     Constitutional: Alert and oriented. Well appearing and in no acute distress. Eyes: Conjunctivae are normal. No swelling around eyes Head: Atraumatic. Nose: No congestion/rhinnorhea. Mouth/Throat: Mucous membranes are moist.   Neck: No stridor. Trachea Midline. FROM Cardiovascular: Normal rate, no swelling noted Respiratory: No increased wob, no stridor Gastrointestinal: Soft and nontender. No distention. No abdominal bruits.  Musculoskeletal: No lower extremity tenderness nor edema.  No joint effusions.  Leg in boot Neurologic:  Normal speech and language. No gross focal  neurologic deficits are appreciated.  Skin:  Skin is warm, dry and intact. No rash noted. Psychiatric: Denies SI but comes in for overdose GU: Deferred   ____________________________________________   LABS (all labs ordered are listed, but only abnormal results are displayed)  Labs Reviewed  RESP PANEL BY RT-PCR (FLU A&B, COVID) ARPGX2  CBC WITH DIFFERENTIAL/PLATELET  COMPREHENSIVE METABOLIC PANEL  SALICYLATE LEVEL  ACETAMINOPHEN LEVEL  URINE DRUG SCREEN, QUALITATIVE ([facility] ONLY)  ETHANOL  POC URINE PREG, ED   ____________________________________________   ED ECG REPORT I, Concha Se, the attending physician, personally viewed and interpreted this ECG.  Normal sinus rate of 100, no ST ovation, no T wave version lead III, normal ____________________________________________  R   PROCEDURES  Procedure(s) performed (including Critical Care):  Procedures   ____________________________________________   INITIAL IMPRESSION / ASSESSMENT AND PLAN / ED COURSE  Susan Velazquez was evaluated in Emergency Department on 02/22/2021 for the symptoms described in the history of present illness. She was evaluated in the context of the global COVID-19 pandemic, which necessitated consideration that the patient might be at risk for infection with the SARS-CoV-2 virus that causes COVID-19. Institutional protocols and algorithms that pertain to the evaluation of patients at risk for COVID-19 are in a state of rapid change based on information released by regulatory bodies including the CDC and federal and state organizations. These policies and algorithms were followed during the patient's care in the ED.    Pt is without any acute medical complaints. No exam findings to suggest medical cause of current presentation. Will order psychiatric screening labs and discuss further w/ psychiatric service.  Will contact poison control.  D/d includes but is not limited to psychiatric disease,  behavioral/personality disorder, inadequate socioeconomic support, medical.  Based on HPI, exam, unremarkable labs, no concern for acute medical problem at this time. No rigidity, clonus, hyperthermia, focal neurologic deficit, diaphoresis, tachycardia, meningismus, ataxia, gait abnormality or other finding to suggest this visit represents a non-psychiatric problem. Screening labs reviewed.    Given this, pt medically cleared, to be dispositioned per Psych.    The patient has been placed in psychiatric observation due to the need to provide a safe environment for the patient while obtaining psychiatric consultation and evaluation, as well as ongoing medical and medication management to treat the patient's condition.  The patient has been placed under full IVC at this time.        ____________________________________________   FINAL CLINICAL IMPRESSION(S) / ED DIAGNOSES   Final diagnoses:  Overdose of undetermined intent, initial encounter      MEDICATIONS GIVEN DURING THIS VISIT:  Medications - No data to display   ED Discharge Orders     None        Note:  This document was prepared using Dragon voice recognition software and may include unintentional dictation errors.    Concha Se, MD 02/22/21 2157

## 2021-02-22 NOTE — ED Notes (Signed)
Pt dressed out into burgundy scrubs with thie tech, Florentina Addison, RN and [employer] in the rm. Pt belongings consist of a set of keys, headphones, a cell phone, one flip flop, a black hoodie, earrings, and green pajama pants. Pt belongings placed into a pt belongings bag and labeled with pt name. Pt has a nose ring that pt refuses to take out. Pt otherwise calm and cooperative while dressing out.

## 2021-02-22 NOTE — ED Notes (Addendum)
This nurse called poison control at this time:   They stated pt has enough for cns depression at this time - monitor for respiratory depression at this time. Do a repeat acetaminophen level in q4

## 2021-02-23 DIAGNOSIS — F332 Major depressive disorder, recurrent severe without psychotic features: Secondary | ICD-10-CM | POA: Diagnosis present

## 2021-02-23 LAB — ACETAMINOPHEN LEVEL: Acetaminophen (Tylenol), Serum: <10 ug/mL — ABNORMAL LOW (ref 10–30)

## 2021-02-23 MED ORDER — ESCITALOPRAM OXALATE 10 MG PO TABS
20.0000 mg | ORAL_TABLET | Freq: Every day | ORAL | Status: DC
  Administered 2021-02-23 – 2021-02-25 (×3): 20 mg via ORAL
  Filled 2021-02-23 (×3): qty 2

## 2021-02-23 MED ORDER — ACETAMINOPHEN 500 MG PO TABS
1000.0000 mg | ORAL_TABLET | Freq: Four times a day (QID) | ORAL | Status: DC | PRN
  Administered 2021-02-23 – 2021-02-24 (×2): 1000 mg via ORAL
  Filled 2021-02-23 (×2): qty 2

## 2021-02-23 MED ORDER — ESKETAMINE HCL (84 MG DOSE) 28 MG/DEVICE NA SOPK: 3.0000 | PACK | NASAL | Status: DC

## 2021-02-23 MED ORDER — LEVOTHYROXINE SODIUM 88 MCG PO TABS
88.0000 ug | ORAL_TABLET | Freq: Every day | ORAL | Status: DC
  Administered 2021-02-23 – 2021-02-25 (×3): 88 ug via ORAL
  Filled 2021-02-23 (×4): qty 1

## 2021-02-23 NOTE — ED Notes (Signed)
MHT spoke with patient and pt then agreed to take her meds.
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
## 2021-02-23 NOTE — ED Notes (Signed)
Pt asked for something for pain for her ankle. MD made aware

## 2021-02-23 NOTE — ED Notes (Signed)
Mother visited pt. Requested to speak to psych team.. Name and number given to psych team

## 2021-02-23 NOTE — ED Notes (Signed)
Rn to bedside to give tylenol and 9AM lexapro. Pt refused either medication. Pt advised she needs something more than tylenol for her pain.

## 2021-02-23 NOTE — Consult Note (Signed)
[facility] Face-to-Face Psychiatry Consult   Reason for Consult:  Overdose, intentional Referring Physician:  EDP Patient Identification: Susan Velazquez MRN:  [id number] Principal Diagnosis: Major depressive disorder, recurrent severe without psychotic features (HCC) Diagnosis:  Principal Problem:   Major depressive disorder, recurrent severe without psychotic features (HCC)   Total Time spent with patient: 1 hour  Subjective:   Susan Velazquez is a 25 y.o. female patient admitted with intentional overdose.  HPI:  25 yo female presented "Pt came in [facility] due to testing suicide hot line, pt took 2 handfuls or 200 mg gabapentin- tylenol handful, cyclobenzaprine, synthroid, buspirone- xanax- pt has hx of OD attempts" per notes on arrival by RN Allred.  On assessment, she minimizes her actions and initially stated she overtook her gabapentin to "get high and pain relief".  Then, she added she took muscle relaxers also. Per the client, "Not a good idea as it can hurt myself."  She states she became dizzy and called the suicide hotline, not 911.  Last suicide attempt was in 2019, accidental overdose noted on 12/10/20.  Denies homicidal ideations, hallucinations, and substance abuse.  She reports her parents are supportive and live locally and her boyfriend comes on the weekends.  Attempted to explain the concerns for her safety and the need for inpatient to no avail, she exclaimed, "I don't want to be here, I'm leaving!"  Past Psychiatric History: depression, anxiety  Risk to Self:   Risk to Others:   Prior Inpatient Therapy:   Prior Outpatient Therapy:    Past Medical History:  Past Medical History:  Diagnosis Date   Anxiety    Depression    Hashimoto's disease     Past Surgical History:  Procedure Laterality Date   KNEE SURGERY     Family History: History reviewed. No pertinent family history. Family Psychiatric  History: unknown Social History:  Social History   Substance and  Sexual Activity  Alcohol Use Yes     Social History   Substance and Sexual Activity  Drug Use Never    Social History   Socioeconomic History   Marital status: Single    Spouse name: Not on file   Number of children: Not on file   Years of education: Not on file   Highest education level: Not on file  Occupational History   Not on file  Tobacco Use   Smoking status: Never   Smokeless tobacco: Never  Vaping Use   Vaping Use: Never used  Substance and Sexual Activity   Alcohol use: Yes   Drug use: Never   Sexual activity: Not on file  Other Topics Concern   Not on file  Social History Narrative   Not on file   Social Determinants of Health   Financial Resource Strain: Not on file  Food Insecurity: Not on file  Transportation Needs: Not on file  Physical Activity: Not on file  Stress: Not on file  Social Connections: Not on file   Additional Social History:    Allergies:   Allergies  Allergen Reactions   Cymbalta [Duloxetine Hcl] Other (See Comments)    Suicidal ideation   Iodine Other (See Comments)    "affects my thyroid"    Labs:  Results for orders placed or performed during the hospital encounter of 02/22/21 (from the past 48 hour(s))  CBC with Differential     Status: None   Collection Time: 02/22/21  8:46 PM  Result Value Ref Range   WBC 6.5  4.0 - 10.5 K/uL   RBC 4.78 3.87 - 5.11 MIL/uL   Hemoglobin 13.2 12.0 - 15.0 g/dL   HCT 07.3 71.0 - 62.6 %   MCV 83.7 80.0 - 100.0 fL   MCH 27.6 26.0 - 34.0 pg   MCHC 33.0 30.0 - 36.0 g/dL   RDW 94.8 54.6 - 27.0 %   Platelets 314 150 - 400 K/uL   nRBC 0.0 0.0 - 0.2 %   Neutrophils Relative % 65 %   Neutro Abs 4.2 1.7 - 7.7 K/uL   Lymphocytes Relative 23 %   Lymphs Abs 1.5 0.7 - 4.0 K/uL   Monocytes Relative 10 %   Monocytes Absolute 0.7 0.1 - 1.0 K/uL   Eosinophils Relative 1 %   Eosinophils Absolute 0.0 0.0 - 0.5 K/uL   Basophils Relative 1 %   Basophils Absolute 0.0 0.0 - 0.1 K/uL   Immature  Granulocytes 0 %   Abs Immature Granulocytes 0.02 0.00 - 0.07 K/uL    Comment: Performed at [facility], [address] [place], [place] [address]  Comprehensive metabolic panel     Status: Abnormal   Collection Time: 02/22/21  8:46 PM  Result Value Ref Range   Sodium 136 135 - 145 mmol/L   Potassium 4.1 3.5 - 5.1 mmol/L   Chloride 104 98 - 111 mmol/L   CO2 25 22 - 32 mmol/L   Glucose, Bld 103 (H) 70 - 99 mg/dL    Comment: Glucose reference range applies only to samples taken after fasting for at least 8 hours.   BUN 11 6 - 20 mg/dL   Creatinine, Ser 3.81 0.44 - 1.00 mg/dL   Calcium 9.5 8.9 - 82.9 mg/dL   Total Protein 8.8 (H) 6.5 - 8.1 g/dL   Albumin 4.4 3.5 - 5.0 g/dL   AST 17 15 - 41 U/L   ALT 17 0 - 44 U/L   Alkaline Phosphatase 72 38 - 126 U/L   Total Bilirubin 0.7 0.3 - 1.2 mg/dL   GFR, Estimated >93 >71 mL/min    Comment: (NOTE) Calculated using the CKD-EPI Creatinine Equation (2021)    Anion gap 7 5 - 15    Comment: Performed at [facility], [address], [place], [place] [address]  Salicylate level     Status: Abnormal   Collection Time: 02/22/21  8:46 PM  Result Value Ref Range   Salicylate Lvl <7.0 (L) 7.0 - 30.0 mg/dL    Comment: Performed at [facility], [address], [place], [place] [address]  Acetaminophen level     Status: Abnormal   Collection Time: 02/22/21  8:46 PM  Result Value Ref Range   Acetaminophen (Tylenol), Serum <10 (L) 10 - 30 ug/mL    Comment: (NOTE) Therapeutic concentrations vary significantly. A range of 10-30 ug/mL  may be an effective concentration for many patients. However, some  are best treated at concentrations outside of this range. Acetaminophen concentrations >150 ug/mL at 4 hours after ingestion  and >50 ug/mL at 12 hours after ingestion are often associated with  toxic reactions.  Performed at [facility], [address] [place], [place] [address]   Ethanol     Status: None    Collection Time: 02/22/21  8:46 PM  Result Value Ref Range   Alcohol, Ethyl (B) <10 <10 mg/dL    Comment: (NOTE) Lowest detectable limit for serum alcohol is 10 mg/dL.  For medical purposes only. Performed at [facility], [phone]  [address] [place], [place] [address]   Urine Drug Screen, Qualitative ([facility] only)     Status: Abnormal   Collection Time: 02/22/21  8:47 PM  Result Value Ref Range   Tricyclic, Ur Screen NONE DETECTED NONE DETECTED   Amphetamines, Ur Screen NONE DETECTED NONE DETECTED   MDMA (Ecstasy)Ur Screen NONE DETECTED NONE DETECTED   Cocaine Metabolite,Ur [place] NONE DETECTED NONE DETECTED   Opiate, Ur Screen POSITIVE (A) NONE DETECTED   Phencyclidine (PCP) Ur S NONE DETECTED NONE DETECTED   Cannabinoid 50 Ng, Ur [place] NONE DETECTED NONE DETECTED   Barbiturates, Ur Screen NONE DETECTED NONE DETECTED   Benzodiazepine, Ur Scrn NONE DETECTED NONE DETECTED   Methadone Scn, Ur NONE DETECTED NONE DETECTED    Comment: (NOTE) Tricyclics + metabolites, urine    Cutoff 1000 ng/mL Amphetamines + metabolites, urine  Cutoff 1000 ng/mL MDMA (Ecstasy), urine              Cutoff 500 ng/mL Cocaine Metabolite, urine          Cutoff 300 ng/mL Opiate + metabolites, urine        Cutoff 300 ng/mL Phencyclidine (PCP), urine         Cutoff 25 ng/mL Cannabinoid, urine                 Cutoff 50 ng/mL Barbiturates + metabolites, urine  Cutoff 200 ng/mL Benzodiazepine, urine              Cutoff 200 ng/mL Methadone, urine                   Cutoff 300 ng/mL  The urine drug screen provides only a preliminary, unconfirmed analytical test result and should not be used for non-medical purposes. Clinical consideration and professional judgment should be applied to any positive drug screen result due to possible interfering substances. A more specific alternate chemical method must be used in order to obtain a confirmed analytical result. Gas chromatography / mass spectrometry (GC/MS) is the  preferred confirm atory method. Performed at [facility], [address] [place], [place] [address]   Resp Panel by RT-PCR (Flu A&B, Covid) Nasopharyngeal Swab     Status: Abnormal   Collection Time: 02/22/21  8:47 PM   Specimen: Nasopharyngeal Swab; Nasopharyngeal(NP) swabs in vial transport medium  Result Value Ref Range   SARS Coronavirus 2 by RT PCR NEGATIVE NEGATIVE    Comment: (NOTE) SARS-CoV-2 target nucleic acids are NOT DETECTED.  The SARS-CoV-2 RNA is generally detectable in upper respiratory specimens during the acute phase of infection. The lowest concentration of SARS-CoV-2 viral copies this assay can detect is 138 copies/mL. A negative result does not preclude SARS-Cov-2 infection and should not be used as the sole basis for treatment or other patient management decisions. A negative result may occur with  improper specimen collection/handling, submission of specimen other than nasopharyngeal swab, presence of viral mutation(s) within the areas targeted by this assay, and inadequate number of viral copies(<138 copies/mL). A negative result must be combined with clinical observations, patient history, and epidemiological information. The expected result is Negative.  Fact Sheet for Patients:  BloggerCourse.com  Fact Sheet for Healthcare Providers:  SeriousBroker.it  This test is no t yet approved or cleared by the [place] FDA and  has been authorized for detection and/or diagnosis of SARS-CoV-2 by FDA under an Emergency Use Authorization (EUA). This EUA will remain  in effect (meaning this test can be used) for the duration  of the COVID-19 declaration under Section 564(b)(1) of the Act, 21 U.S.C.section 360bbb-3(b)(1), unless the authorization is terminated  or revoked sooner.       Influenza A by PCR POSITIVE (A) NEGATIVE   Influenza B by PCR NEGATIVE NEGATIVE    Comment: (NOTE) The Xpert  Xpress SARS-CoV-2/FLU/RSV plus assay is intended as an aid in the diagnosis of influenza from Nasopharyngeal swab specimens and should not be used as a sole basis for treatment. Nasal washings and aspirates are unacceptable for Xpert Xpress SARS-CoV-2/FLU/RSV testing.  Fact Sheet for Patients: BloggerCourse.com  Fact Sheet for Healthcare Providers: SeriousBroker.it  This test is not yet approved or cleared by the [place] FDA and has been authorized for detection and/or diagnosis of SARS-CoV-2 by FDA under an Emergency Use Authorization (EUA). This EUA will remain in effect (meaning this test can be used) for the duration of the COVID-19 declaration under Section 564(b)(1) of the Act, 21 U.S.C. section 360bbb-3(b)(1), unless the authorization is terminated or revoked.  Performed at [facility], [address] [place], [place] [address]   POC urine preg, ED     Status: None   Collection Time: 02/22/21  9:16 PM  Result Value Ref Range   Preg Test, Ur NEGATIVE NEGATIVE    Comment:        THE SENSITIVITY OF THIS METHODOLOGY IS >24 mIU/mL   Acetaminophen level     Status: Abnormal   Collection Time: 02/23/21 12:41 AM  Result Value Ref Range   Acetaminophen (Tylenol), Serum <10 (L) 10 - 30 ug/mL    Comment: (NOTE) Therapeutic concentrations vary significantly. A range of 10-30 ug/mL  may be an effective concentration for many patients. However, some  are best treated at concentrations outside of this range. Acetaminophen concentrations >150 ug/mL at 4 hours after ingestion  and >50 ug/mL at 12 hours after ingestion are often associated with  toxic reactions.  Performed at [facility], [address] [place], [place] [address]     Current Facility-Administered Medications  Medication Dose Route Frequency Provider Last Rate Last Admin   acetaminophen (TYLENOL) tablet 1,000 mg  1,000 mg Oral Q6H PRN  Merwyn Katos, MD   1,000 mg at 02/23/21 0901   escitalopram (LEXAPRO) tablet 20 mg  20 mg Oral Daily Ward, Kristen N, DO   20 mg at 02/23/21 0901   levothyroxine (SYNTHROID) tablet 88 mcg  88 mcg Oral Daily Ward, Kristen N, DO   88 mcg at 02/23/21 0845   Current Outpatient Medications  Medication Sig Dispense Refill   dicyclomine (BENTYL) 10 MG capsule Take 1 capsule (10 mg total) by mouth 4 (four) times daily for 14 days. 56 capsule 0   escitalopram (LEXAPRO) 20 MG tablet Take 20 mg by mouth daily.     levothyroxine (SYNTHROID) 100 MCG tablet Take 1 tablet (100 mcg total) by mouth daily. 30 tablet 1   levothyroxine (SYNTHROID) 88 MCG tablet Take 88 mcg by mouth daily.     ondansetron (ZOFRAN ODT) 4 MG disintegrating tablet Take 1 tablet (4 mg total) by mouth every 8 (eight) hours as needed for nausea or vomiting. 12 tablet 0   SPRAVATO, 84 MG DOSE, 28 MG/DEVICE SOPK Place 3 sprays into the nose as directed.      Musculoskeletal: Strength & Muscle Tone: within normal limits with limp from leg boot Gait & Station: normal Patient leans: N/A  Psychiatric Specialty Exam: Physical Exam Vitals and nursing note reviewed.  Constitutional:  Appearance: Normal appearance.  HENT:     Head: Normocephalic.     Nose: Nose normal.  Pulmonary:     Effort: Pulmonary effort is normal.  Musculoskeletal:     Cervical back: Normal range of motion.  Neurological:     General: No focal deficit present.     Mental Status: She is alert and oriented to person, place, and time.  Psychiatric:        Attention and Perception: Attention and perception normal.        Mood and Affect: Mood is anxious and depressed.        Speech: Speech normal.        Behavior: Behavior normal. Behavior is cooperative.        Thought Content: Thought content includes suicidal ideation.        Cognition and Memory: Cognition and memory normal.        Judgment: Judgment is impulsive.    Review of Systems   Musculoskeletal:        Leg pain  Psychiatric/Behavioral:  Positive for depression and suicidal ideas. The patient is nervous/anxious.   All other systems reviewed and are negative.  Blood pressure 108/75, pulse 87, temperature 100 F (37.8 C), temperature source Oral, resp. rate 16, height 5\' 3"  (1.6 m), weight 80 kg, SpO2 100 %.Body mass index is 31.24 kg/m.  General Appearance: Casual  Eye Contact:  Fair  Speech:  Normal Rate  Volume:  Normal  Mood:  Anxious and Depressed  Affect:  Congruent  Thought Process:  Coherent and Descriptions of Associations: Intact  Orientation:  Full (Time, Place, and Person)  Thought Content:  Rumination  Suicidal Thoughts:  Yes.  with intent/plan last night  Homicidal Thoughts:  No  Memory:  Immediate;   Fair Recent;   Fair Remote;   Fair  Judgement:  Poor  Insight:  Lacking  Psychomotor Activity:  Decreased  Concentration:  Concentration: Fair and Attention Span: Fair  Recall:  Fair  Fund of Knowledge:  Good  Language:  Good  Akathisia:  No  Handed:  Right  AIMS (if indicated):     Assets:  Housing Leisure Time Physical Health Resilience Social Support  ADL's:  Intact  Cognition:  WNL  Sleep:        Physical Exam: Physical Exam Vitals and nursing note reviewed.  Constitutional:      Appearance: Normal appearance.  HENT:     Head: Normocephalic.     Nose: Nose normal.  Pulmonary:     Effort: Pulmonary effort is normal.  Musculoskeletal:     Cervical back: Normal range of motion.  Neurological:     General: No focal deficit present.     Mental Status: She is alert and oriented to person, place, and time.  Psychiatric:        Attention and Perception: Attention and perception normal.        Mood and Affect: Mood is anxious and depressed.        Speech: Speech normal.        Behavior: Behavior normal. Behavior is cooperative.        Thought Content: Thought content includes suicidal ideation.        Cognition and Memory:  Cognition and memory normal.        Judgment: Judgment is impulsive.   Review of Systems  Musculoskeletal:        Leg pain  Psychiatric/Behavioral:  Positive for depression and suicidal ideas. The patient is  nervous/anxious.   All other systems reviewed and are negative. Blood pressure 108/75, pulse 87, temperature 100 F (37.8 C), temperature source Oral, resp. rate 16, height  (1.6 m), weight 80 kg, SpO2 100 %. Body mass index is 31.24 kg/m.  Treatment Plan Summary: Daily contact with patient to assess and evaluate symptoms and progress in treatment, Medication management, and Plan : Major depressive disorder, recurrent, severe: -Continue Lexapro 20 mg daily -Admit inpatient for stabilization once cleared from the flu  Disposition: Recommend psychiatric Inpatient admission when medically cleared.  Nanine Means, NP 02/23/2021 2:18 PM

## 2021-02-23 NOTE — ED Provider Notes (Signed)
Emergency Medicine Observation Re-evaluation Note  Susan Velazquez is a 25 y.o. female, seen on rounds today.  Pt initially presented to the ED for complaints of Mental Health Problem Currently, the patient is resting comfortably.  Physical Exam  BP 108/75 (BP Location: Left Arm)   Pulse 87   Temp 100 F (37.8 C) (Oral)   Resp 16   Ht 5\' 3"  (1.6 m)   Wt 80 kg   SpO2 100%   BMI 31.24 kg/m  Physical Exam Gen: No acute distress  Resp: Normal rise and fall of chest Neuro: Moving all four extremities Psych: Resting currently, calm and cooperative when awake    ED Course / MDM  EKG:   I have reviewed the labs performed to date as well as medications administered while in observation.  Recent changes in the last 24 hours include no acute events overnight.  Plan  Current plan is for psychiatric evaluation for further disposition. [facility] is under involuntary commitment.      Delrose Rohwer, [name], DO 02/23/21 [phone]

## 2021-02-24 MED ORDER — OXYMETAZOLINE HCL 0.05 % NA SOLN
1.0000 | Freq: Once | NASAL | Status: AC
  Administered 2021-02-24: 01:00:00 1 via NASAL
  Filled 2021-02-24: qty 30

## 2021-02-24 NOTE — ED Notes (Signed)
Pt's mother came to visit.  NP making rounds and spoke with pt while mother was in the room.  Pt and mother agreeable to inpatient admission.

## 2021-02-24 NOTE — ED Notes (Signed)
Pt wanting to speak with the doctor again.  Pt states she made a mistake by taking the pills and will not do that again.  Pt also states her parents want to take her home and care for her until she sees her psychiatrist Thursday.  Pt states "sitting in isolation" is not helpful.  Pt goes on to tell this Clinical research associate other psych ERs have activities to do.

## 2021-02-24 NOTE — [facility] Counselor (Signed)
TTS assessed patient this AM 02/24/21...  Patient stated she was not SI nor HI; pt stated she has a therapist and missed appointment due to holiday;pt works with [employer] in [place] for the past year; pt stated she has tons of support with family and friends; Pt feels she does not need inpatient as she will speak with her therapist on Thursday;  TTS Sahithi Ordoyne

## 2021-02-24 NOTE — ED Notes (Signed)
Meal tray given 

## 2021-02-24 NOTE — ED Notes (Signed)
IVC/pending inpatient psych admission 

## 2021-02-24 NOTE — Consult Note (Signed)
[facility] Face-to-Face Psychiatry Consult   Reason for Consult:  Overdose, intentional Referring Physician:  EDP Patient Identification: Susan Velazquez MRN:  [id number] Principal Diagnosis: Major depressive disorder, recurrent severe without psychotic features (HCC) Diagnosis:  Principal Problem:   Major depressive disorder, recurrent severe without psychotic features (HCC)   Total Time spent with patient: 1 hour  Subjective:   Susan Velazquez is a 25 y.o. female patient admitted with intentional overdose. Patient in agreement with inpatient treatment plan. She was calm/cooperative on interview, and apologized to team for her behavior prior to today, stating "I was still messed up on those pills." Patient's mother entered the room during assessment and gave further information regarding patient's recent stressors. Per mother, patient had been receiving monthly Spravato treatment, which had been well-tolerated until recently. Per patient, she missed last month's dose. 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  Both are agreeable for admission to assist with her issues.  HPI:  25 yo female presented "Pt came in [facility] due to testing suicide hot line, pt took 2 handfuls or 200 mg gabapentin- tylenol handful, cyclobenzaprine, synthroid, buspirone- xanax- pt has hx of OD attempts" per notes on arrival by RN Susan Velazquez.  On assessment, she minimizes her actions and initially stated she overtook her gabapentin to "get high and pain relief".  Then, she added she took muscle relaxers also. Per the client, "Not a good idea as it can hurt myself."  She states she became dizzy and called the suicide hotline, not 911.  Last suicide attempt was in 2019, accidental overdose noted on 12/10/20.  Denies homicidal  ideations, hallucinations, and substance abuse.  She reports her parents are supportive and live locally and her boyfriend comes on the weekends.  Attempted to explain the concerns for her safety and the need for inpatient to no avail, she exclaimed, "I don't want to be here, I'm leaving!"  Past Psychiatric History: depression, anxiety  Risk to Self:  Yes Risk to Others:  No Prior Inpatient Therapy:  yes Prior Outpatient Therapy:  Dr Susan Velazquez  Past Medical History:  Past Medical History:  Diagnosis Date   Anxiety    Depression    Hashimoto's disease     Past Surgical History:  Procedure Laterality Date   KNEE SURGERY     Family History: History reviewed. No pertinent family history. Family Psychiatric  History: unknown Social History:  Social History   Substance and Sexual Activity  Alcohol Use Yes     Social History   Substance and Sexual Activity  Drug Use Never    Social History   Socioeconomic History   Marital status: Single    Spouse name: Not on file   Number of children: Not on file   Years of education: Not on file   Highest education level: Not on file  Occupational History   Not on file  Tobacco Use   Smoking status: Never   Smokeless tobacco: Never  Vaping Use   Vaping Use: Never used  Substance and Sexual Activity   Alcohol use: Yes   Drug use: Never   Sexual activity: Not on file  Other Topics Concern   Not on file  Social History Narrative   Not on file   Social Determinants of Health   Financial Resource  Strain: Not on file  Food Insecurity: Not on file  Transportation Needs: Not on file  Physical Activity: Not on file  Stress: Not on file  Social Connections: Not on file   Additional Social History:    Allergies:   Allergies  Allergen Reactions   Cymbalta [Duloxetine Hcl] Other (See Comments)    Suicidal ideation   Iodine Other (See Comments)    "affects my thyroid"    Labs:  Results for orders placed or performed during the  hospital encounter of 02/22/21 (from the past 48 hour(s))  CBC with Differential     Status: None   Collection Time: 02/22/21  8:46 PM  Result Value Ref Range   WBC 6.5 4.0 - 10.5 K/uL   RBC 4.78 3.87 - 5.11 MIL/uL   Hemoglobin 13.2 12.0 - 15.0 g/dL   HCT 99.2 42.6 - 83.4 %   MCV 83.7 80.0 - 100.0 fL   MCH 27.6 26.0 - 34.0 pg   MCHC 33.0 30.0 - 36.0 g/dL   RDW 19.6 22.2 - 97.9 %   Platelets 314 150 - 400 K/uL   nRBC 0.0 0.0 - 0.2 %   Neutrophils Relative % 65 %   Neutro Abs 4.2 1.7 - 7.7 K/uL   Lymphocytes Relative 23 %   Lymphs Abs 1.5 0.7 - 4.0 K/uL   Monocytes Relative 10 %   Monocytes Absolute 0.7 0.1 - 1.0 K/uL   Eosinophils Relative 1 %   Eosinophils Absolute 0.0 0.0 - 0.5 K/uL   Basophils Relative 1 %   Basophils Absolute 0.0 0.0 - 0.1 K/uL   Immature Granulocytes 0 %   Abs Immature Granulocytes 0.02 0.00 - 0.07 K/uL    Comment: Performed at [facility], [address] [place], [place] [address]  Comprehensive metabolic panel     Status: Abnormal   Collection Time: 02/22/21  8:46 PM  Result Value Ref Range   Sodium 136 135 - 145 mmol/L   Potassium 4.1 3.5 - 5.1 mmol/L   Chloride 104 98 - 111 mmol/L   CO2 25 22 - 32 mmol/L   Glucose, Bld 103 (H) 70 - 99 mg/dL    Comment: Glucose reference range applies only to samples taken after fasting for at least 8 hours.   BUN 11 6 - 20 mg/dL   Creatinine, Ser 9.41 0.44 - 1.00 mg/dL   Calcium 9.5 8.9 - 74.0 mg/dL   Total Protein 8.8 (H) 6.5 - 8.1 g/dL   Albumin 4.4 3.5 - 5.0 g/dL   AST 17 15 - 41 U/L   ALT 17 0 - 44 U/L   Alkaline Phosphatase 72 38 - 126 U/L   Total Bilirubin 0.7 0.3 - 1.2 mg/dL   GFR, Estimated >81 >44 mL/min    Comment: (NOTE) Calculated using the CKD-EPI Creatinine Equation (2021)    Anion gap 7 5 - 15    Comment: Performed at [facility], [address] [place], [place] [address]  Salicylate level     Status: Abnormal   Collection Time: 02/22/21  8:46 PM  Result Value Ref  Range   Salicylate Lvl <7.0 (L) 7.0 - 30.0 mg/dL    Comment: Performed at [facility], [address] [place], [place] [address]  Acetaminophen level     Status: Abnormal   Collection Time: 02/22/21  8:46 PM  Result Value Ref Range   Acetaminophen (Tylenol), Serum <10 (L) 10 - 30 ug/mL    Comment: (NOTE) Therapeutic  concentrations vary significantly. A range of 10-30 ug/mL  may be an effective concentration for many patients. However, some  are best treated at concentrations outside of this range. Acetaminophen concentrations >150 ug/mL at 4 hours after ingestion  and >50 ug/mL at 12 hours after ingestion are often associated with  toxic reactions.  Performed at [facility], [address] [place], [place] [address]   Ethanol     Status: None   Collection Time: 02/22/21  8:46 PM  Result Value Ref Range   Alcohol, Ethyl (B) <10 <10 mg/dL    Comment: (NOTE) Lowest detectable limit for serum alcohol is 10 mg/dL.  For medical purposes only. Performed at [facility], [address], [place], [place] [address]   Urine Drug Screen, Qualitative [facility] only)     Status: Abnormal   Collection Time: 02/22/21  8:47 PM  Result Value Ref Range   Tricyclic, Ur Screen NONE DETECTED NONE DETECTED   Amphetamines, Ur Screen NONE DETECTED NONE DETECTED   MDMA (Ecstasy)Ur Screen NONE DETECTED NONE DETECTED   Cocaine Metabolite,Ur [place] NONE DETECTED NONE DETECTED   Opiate, Ur Screen POSITIVE (A) NONE DETECTED   Phencyclidine (PCP) Ur S NONE DETECTED NONE DETECTED   Cannabinoid 50 Ng, Ur [place] NONE DETECTED NONE DETECTED   Barbiturates, Ur Screen NONE DETECTED NONE DETECTED   Benzodiazepine, Ur Scrn NONE DETECTED NONE DETECTED   Methadone Scn, Ur NONE DETECTED NONE DETECTED    Comment: (NOTE) Tricyclics + metabolites, urine    Cutoff 1000 ng/mL Amphetamines + metabolites, urine  Cutoff 1000 ng/mL MDMA (Ecstasy), urine              Cutoff 500 ng/mL Cocaine  Metabolite, urine          Cutoff 300 ng/mL Opiate + metabolites, urine        Cutoff 300 ng/mL Phencyclidine (PCP), urine         Cutoff 25 ng/mL Cannabinoid, urine                 Cutoff 50 ng/mL Barbiturates + metabolites, urine  Cutoff 200 ng/mL Benzodiazepine, urine              Cutoff 200 ng/mL Methadone, urine                   Cutoff 300 ng/mL  The urine drug screen provides only a preliminary, unconfirmed analytical test result and should not be used for non-medical purposes. Clinical consideration and professional judgment should be applied to any positive drug screen result due to possible interfering substances. A more specific alternate chemical method must be used in order to obtain a confirmed analytical result. Gas chromatography / mass spectrometry (GC/MS) is the preferred confirm atory method. Performed at [facility], [address] [place], [place] [address]   Resp Panel by RT-PCR (Flu A&B, Covid) Nasopharyngeal Swab     Status: Abnormal   Collection Time: 02/22/21  8:47 PM   Specimen: Nasopharyngeal Swab; Nasopharyngeal(NP) swabs in vial transport medium  Result Value Ref Range   SARS Coronavirus 2 by RT PCR NEGATIVE NEGATIVE    Comment: (NOTE) SARS-CoV-2 target nucleic acids are NOT DETECTED.  The SARS-CoV-2 RNA is generally detectable in upper respiratory specimens during the acute phase of infection. The lowest concentration of SARS-CoV-2 viral copies this assay can detect is 138 copies/mL. A negative result does not preclude SARS-Cov-2 infection and should not be used as the sole basis for treatment or other  patient management decisions. A negative result may occur with  improper specimen collection/handling, submission of specimen other than nasopharyngeal swab, presence of viral mutation(s) within the areas targeted by this assay, and inadequate number of viral copies(<138 copies/mL). A negative result must be combined with clinical  observations, patient history, and epidemiological information. The expected result is Negative.  Fact Sheet for Patients:  BloggerCourse.com  Fact Sheet for Healthcare Providers:  SeriousBroker.it  This test is no t yet approved or cleared by the [place] FDA and  has been authorized for detection and/or diagnosis of SARS-CoV-2 by FDA under an Emergency Use Authorization (EUA). This EUA will remain  in effect (meaning this test can be used) for the duration of the COVID-19 declaration under Section 564(b)(1) of the Act, 21 U.S.C.section 360bbb-3(b)(1), unless the authorization is terminated  or revoked sooner.       Influenza A by PCR POSITIVE (A) NEGATIVE   Influenza B by PCR NEGATIVE NEGATIVE    Comment: (NOTE) The Xpert Xpress SARS-CoV-2/FLU/RSV plus assay is intended as an aid in the diagnosis of influenza from Nasopharyngeal swab specimens and should not be used as a sole basis for treatment. Nasal washings and aspirates are unacceptable for Xpert Xpress SARS-CoV-2/FLU/RSV testing.  Fact Sheet for Patients: BloggerCourse.com  Fact Sheet for Healthcare Providers: SeriousBroker.it  This test is not yet approved or cleared by the [place] FDA and has been authorized for detection and/or diagnosis of SARS-CoV-2 by FDA under an Emergency Use Authorization (EUA). This EUA will remain in effect (meaning this test can be used) for the duration of the COVID-19 declaration under Section 564(b)(1) of the Act, 21 U.S.C. section 360bbb-3(b)(1), unless the authorization is terminated or revoked.  Performed at [facility], [address] [place], [place] [address]   POC urine preg, ED     Status: None   Collection Time: 02/22/21  9:16 PM  Result Value Ref Range   Preg Test, Ur NEGATIVE NEGATIVE    Comment:        THE SENSITIVITY OF THIS METHODOLOGY IS >24  mIU/mL   Acetaminophen level     Status: Abnormal   Collection Time: 02/23/21 12:41 AM  Result Value Ref Range   Acetaminophen (Tylenol), Serum <10 (L) 10 - 30 ug/mL    Comment: (NOTE) Therapeutic concentrations vary significantly. A range of 10-30 ug/mL  may be an effective concentration for many patients. However, some  are best treated at concentrations outside of this range. Acetaminophen concentrations >150 ug/mL at 4 hours after ingestion  and >50 ug/mL at 12 hours after ingestion are often associated with  toxic reactions.  Performed at [facility], [address] [place], [place] [address]     Current Facility-Administered Medications  Medication Dose Route Frequency Provider Last Rate Last Admin   acetaminophen (TYLENOL) tablet 1,000 mg  1,000 mg Oral Q6H PRN Merwyn Katos, MD   1,000 mg at 02/24/21 0040   escitalopram (LEXAPRO) tablet 20 mg  20 mg Oral Daily Ward, Kristen N, DO   20 mg at 02/24/21 4008   levothyroxine (SYNTHROID) tablet 88 mcg  88 mcg Oral Daily Ward, Kristen N, DO   88 mcg at 02/24/21 6761   Current Outpatient Medications  Medication Sig Dispense Refill   dicyclomine (BENTYL) 10 MG capsule Take 1 capsule (10 mg total) by mouth 4 (four) times daily for 14 days. 56 capsule 0   escitalopram (LEXAPRO) 20 MG tablet Take 20 mg by mouth daily.  levothyroxine (SYNTHROID) 100 MCG tablet Take 1 tablet (100 mcg total) by mouth daily. 30 tablet 1   levothyroxine (SYNTHROID) 88 MCG tablet Take 88 mcg by mouth daily.     ondansetron (ZOFRAN ODT) 4 MG disintegrating tablet Take 1 tablet (4 mg total) by mouth every 8 (eight) hours as needed for nausea or vomiting. 12 tablet 0   SPRAVATO, 84 MG DOSE, 28 MG/DEVICE SOPK Place 3 sprays into the nose as directed.      Musculoskeletal: Strength & Muscle Tone: within normal limits with limp from leg boot Gait & Station: normal Patient leans: N/A  Psychiatric Specialty Exam: Physical Exam Vitals and  nursing note reviewed.  Constitutional:      Appearance: Normal appearance.  HENT:     Head: Normocephalic.     Nose: Nose normal.  Pulmonary:     Effort: Pulmonary effort is normal.  Musculoskeletal:     Cervical back: Normal range of motion.  Neurological:     General: No focal deficit present.     Mental Status: She is alert and oriented to person, place, and time.  Psychiatric:        Attention and Perception: Attention and perception normal.        Mood and Affect: Mood is anxious and depressed.        Speech: Speech normal.        Behavior: Behavior normal. Behavior is cooperative.        Thought Content: Thought content includes suicidal ideation.        Cognition and Memory: Cognition and memory normal.        Judgment: Judgment is impulsive.    Review of Systems  Musculoskeletal:        Leg pain  Psychiatric/Behavioral:  Positive for depression and suicidal ideas. The patient is nervous/anxious.   All other systems reviewed and are negative.  Blood pressure 128/72, pulse 73, temperature 97.8 F (36.6 C), temperature source Oral, resp. rate 18, height 5\' 3"  (1.6 m), weight 80 kg, SpO2 98 %.Body mass index is 31.24 kg/m.  General Appearance: Casual  Eye Contact:  Fair  Speech:  Normal Rate  Volume:  Normal  Mood:  Anxious and Depressed  Affect:  Congruent  Thought Process:  Coherent and Descriptions of Associations: Intact  Orientation:  Full (Time, Place, and Person)  Thought Content:  Rumination  Suicidal Thoughts:  Yes.  with intent/plan last night  Homicidal Thoughts:  No  Memory:  Immediate;   Fair Recent;   Fair Remote;   Fair  Judgement:  Poor  Insight:  Lacking  Psychomotor Activity:  Decreased  Concentration:  Concentration: Fair and Attention Span: Fair  Recall:  Fair  Fund of Knowledge:  Good  Language:  Good  Akathisia:  No  Handed:  Right  AIMS (if indicated):     Assets:  Housing Leisure Time Physical Health Resilience Social Support   ADL's:  Intact  Cognition:  WNL  Sleep:        Physical Exam: Physical Exam Vitals and nursing note reviewed.  Constitutional:      Appearance: Normal appearance.  HENT:     Head: Normocephalic.     Nose: Nose normal.  Pulmonary:     Effort: Pulmonary effort is normal.  Musculoskeletal:     Cervical back: Normal range of motion.  Neurological:     General: No focal deficit present.     Mental Status: She is alert and oriented to person, place, and  time.  Psychiatric:        Attention and Perception: Attention and perception normal.        Mood and Affect: Mood is anxious and depressed.        Speech: Speech normal.        Behavior: Behavior normal. Behavior is cooperative.        Thought Content: Thought content includes suicidal ideation.        Cognition and Memory: Cognition and memory normal.        Judgment: Judgment is impulsive.   Review of Systems  Musculoskeletal:        Leg pain  Psychiatric/Behavioral:  Positive for depression and suicidal ideas. The patient is nervous/anxious.   All other systems reviewed and are negative. Blood pressure 128/72, pulse 73, temperature 97.8 F (36.6 C), temperature source Oral, resp. rate 18, height  (1.6 m), weight 80 kg, SpO2 98 %. Body mass index is 31.24 kg/m.  Treatment Plan Summary: Daily contact with patient to assess and evaluate symptoms and progress in treatment, Medication management, and Plan : Major depressive disorder, recurrent, severe: -Continue Lexapro 20 mg daily -Admit inpatient for stabilization once cleared from the flu  Disposition: Recommend psychiatric Inpatient admission when medically cleared.  Nanine Means, NP 02/24/2021 3:27 PM

## 2021-02-25 NOTE — ED Provider Notes (Signed)
Emergency Medicine Observation Re-evaluation Note  Susan Velazquez is a 25 y.o. female, seen on rounds today.  Pt initially presented to the ED for complaints of Mental Health Problem Currently, the patient is resting.  Physical Exam  BP 128/72   Pulse 73   Temp 97.8 F (36.6 C) (Oral)   Resp 18   Ht 1.6 m (5\' 3" )   Wt 80 kg   SpO2 98%   BMI 31.24 kg/m  Physical Exam Gen:  No acute distress Resp:  Breathing easily and comfortably, no accessory muscle usage Neuro:  Moving all four extremities, no gross focal neuro deficits Psych:  Resting currently, calm when awake  ED Course / MDM    I have reviewed the labs performed to date as well as medications administered while in observation.  Recent changes in the last 24 hours include no significant changes.  Plan  Current plan is for psych placement. Susan Velazquez is under involuntary commitment.      Susan Bookman, MD 02/25/21 [phone]

## 2021-02-25 NOTE — ED Notes (Signed)
IVC/Pending Inpt Admit  

## 2021-02-25 NOTE — ED Provider Notes (Signed)
Patient seen by psychiatry and cleared for discharge home with her parents.   Georga Hacking, MD 02/25/21 [phone]

## 2021-02-25 NOTE — Consult Note (Signed)
[facility] Face-to-Face Psychiatry Consult   Reason for Consult:  re-assess Referring Physician:  EDP Patient Identification: Susan Velazquez MRN:  [id number] Principal Diagnosis: Major depressive disorder, recurrent severe without psychotic features (HCC) Diagnosis:  Principal Problem:   Major depressive disorder, recurrent severe without psychotic features (HCC)   Total Time spent with patient: 30 minutes  Subjective:   Susan Velazquez is a 25 y.o. presented to ED with intentional overdose. It was recommended that patient be admitted to inpatient psychiatric unit for treatment. Unfortunately, patient has tested positive for flu and cannot be admitted to inpatient for 7 days. Patient tells this writer that "I know what I did was stupid; I needed to get back on my Lexapro. I will be fine now. When I go off it I do dumb stuff." Patient states that she no longer has suicidal thoughts. She states that she wants to go home. She injured her foot a few weeks ago when walking her dog and was supposed to have surgery 11/29. That will be postponed. Patient verbalizes understanding that she will need to go stay with parents under their supervision for now. She adamantly denies suicidal thoughts and states she has a plan for finding a new job. Talked to patient at length regarding sleep and mental health.      Collateral from Mother [phone]): Talked to mother for more than 20 min. Mother states she does not believe that patient staying in the ED for 7 days is going to be helpful to patient. She states that she is aware that patient has borderline personality disorder and beng inpatient really isn't going to help that. Mother says she will bring patient home to stay with the parents and will call patient's psychiatrist (Dr. Daleen Squibb [phone]).   Past Psychiatric History: see previous  Risk to Self:   Risk to Others:   Prior Inpatient Therapy:   Prior Outpatient Therapy:    Past Medical  History:  Past Medical History:  Diagnosis Date   Anxiety    Depression    Hashimoto's disease     Past Surgical History:  Procedure Laterality Date   KNEE SURGERY     Family History: History reviewed. No pertinent family history. Family Psychiatric  History: See previous Social History:  Social History   Substance and Sexual Activity  Alcohol Use Yes     Social History   Substance and Sexual Activity  Drug Use Never    Social History   Socioeconomic History   Marital status: Single    Spouse name: Not on file   Number of children: Not on file   Years of education: Not on file   Highest education level: Not on file  Occupational History   Not on file  Tobacco Use   Smoking status: Never   Smokeless tobacco: Never  Vaping Use   Vaping Use: Never used  Substance and Sexual Activity   Alcohol use: Yes   Drug use: Never   Sexual activity: Not on file  Other Topics Concern   Not on file  Social History Narrative   Not on file   Social Determinants of Health   Financial Resource Strain: Not on file  Food Insecurity: Not on file  Transportation Needs: Not on file  Physical Activity: Not on file  Stress: Not on file  Social Connections: Not on file   Additional Social History:    Allergies:   Allergies  Allergen Reactions   Cymbalta [Duloxetine Hcl] Other (See Comments)  Suicidal ideation   Iodine Other (See Comments)    "affects my thyroid"    Labs: No results found for this or any previous visit (from the past 48 hour(s)).  Current Facility-Administered Medications  Medication Dose Route Frequency Provider Last Rate Last Admin   acetaminophen (TYLENOL) tablet 1,000 mg  1,000 mg Oral Q6H PRN Merwyn Katos, MD   1,000 mg at 02/24/21 0040   escitalopram (LEXAPRO) tablet 20 mg  20 mg Oral Daily Ward, Kristen N, DO   20 mg at 02/25/21 0940   levothyroxine (SYNTHROID) tablet 88 mcg  88 mcg Oral Daily Ward, Kristen N, DO   88 mcg at 02/25/21 0940    Current Outpatient Medications  Medication Sig Dispense Refill   escitalopram (LEXAPRO) 20 MG tablet Take 20 mg by mouth daily.     levothyroxine (SYNTHROID) 88 MCG tablet Take 88 mcg by mouth daily.     ondansetron (ZOFRAN ODT) 4 MG disintegrating tablet Take 1 tablet (4 mg total) by mouth every 8 (eight) hours as needed for nausea or vomiting. (Patient not taking: Reported on 02/25/2021) 12 tablet 0   SPRAVATO, 84 MG DOSE, 28 MG/DEVICE SOPK Place 3 sprays into the nose as directed.      Musculoskeletal: Strength & Muscle Tone: within normal limits Gait & Station: normal Patient leans: N/A     Psychiatric Specialty Exam:  Presentation  General Appearance: Appropriate for Environment  Eye Contact:Good  Speech:Clear and Coherent  Speech Volume:Normal  Handedness:No data recorded  Mood and Affect  Mood:Euthymic; Anxious  Affect:Appropriate   Thought Process  Thought Processes:Coherent  Descriptions of Associations:Intact  Orientation:Full (Time, Place and Person)  Thought Content:Logical  History of Schizophrenia/Schizoaffective disorder:No  Duration of Psychotic Symptoms:No data recorded Hallucinations:No data recorded Ideas of Reference:None  Suicidal Thoughts:No data recorded Homicidal Thoughts:No data recorded  Sensorium  Memory:Immediate Good; Recent Good  Judgment:Fair  Insight:Good   Executive Functions  Concentration:No data recorded Attention Span:Good  Recall:Good  Fund of Knowledge:Good  Language:Good   Psychomotor Activity  Psychomotor Activity:No data recorded  Assets  Assets:Financial Resources/Insurance; Desire for Improvement; Physical Health; Social Support; Resilience; Housing   Sleep  Sleep:No data recorded  Physical Exam: Physical Exam Vitals and nursing note reviewed.  HENT:     Head: Normocephalic.     Nose: No congestion or rhinorrhea.  Eyes:     General:        Right eye: No discharge.        Left  eye: No discharge.  Pulmonary:     Effort: Pulmonary effort is normal.  Musculoskeletal:     Cervical back: Normal range of motion.  Skin:    General: Skin is dry.  Neurological:     Mental Status: She is oriented to person, place, and time.  Psychiatric:        Attention and Perception: Attention normal.        Mood and Affect: Mood normal.        Speech: Speech normal.        Behavior: Behavior normal.        Thought Content: Thought content is not paranoid or delusional. Thought content does not include homicidal or suicidal ideation. Thought content does not include homicidal or suicidal plan.        Cognition and Memory: Cognition normal.   Review of Systems  Reason unable to perform ROS: Pt has flu. Not symptomatic.  Psychiatric/Behavioral:  Positive for depression (chronic, stable).   Blood pressure Marland Kitchen)  140/101, pulse 97, temperature 98.5 F (36.9 C), temperature source Oral, resp. rate 18, height 5\' 3"  (1.6 m), weight 80 kg, SpO2 100 %. Body mass index is 31.24 kg/m.  Treatment Plan Summary: Plan Patient to discharge home with parents. Discussed with EDP and Dr. . Patient to follow up with her outpatient psychiatrist tomorrow.  Disposition: No evidence of imminent risk to self or others at present.   Discussed crisis plan, support from social network, calling 911, coming to the Emergency Department, and calling Suicide Hotline.  Toni Amend, NP 02/25/2021 5:54 PM

## 2021-04-09 DIAGNOSIS — R69 Illness, unspecified: Secondary | ICD-10-CM | POA: Diagnosis not present

## 2021-04-09 DIAGNOSIS — Z23 Encounter for immunization: Secondary | ICD-10-CM | POA: Diagnosis not present

## 2021-04-09 DIAGNOSIS — R5383 Other fatigue: Secondary | ICD-10-CM | POA: Diagnosis not present

## 2021-04-12 DIAGNOSIS — S4992XA Unspecified injury of left shoulder and upper arm, initial encounter: Secondary | ICD-10-CM | POA: Diagnosis not present

## 2021-04-23 DIAGNOSIS — J4520 Mild intermittent asthma, uncomplicated: Secondary | ICD-10-CM | POA: Diagnosis not present

## 2021-04-23 DIAGNOSIS — R69 Illness, unspecified: Secondary | ICD-10-CM | POA: Diagnosis not present

## 2021-04-25 DIAGNOSIS — R4020 Unspecified coma: Secondary | ICD-10-CM | POA: Diagnosis not present

## 2021-04-25 DIAGNOSIS — R569 Unspecified convulsions: Secondary | ICD-10-CM | POA: Diagnosis not present

## 2021-05-01 DIAGNOSIS — R55 Syncope and collapse: Secondary | ICD-10-CM | POA: Diagnosis not present

## 2021-05-02 DIAGNOSIS — R569 Unspecified convulsions: Secondary | ICD-10-CM | POA: Diagnosis not present

## 2021-05-02 DIAGNOSIS — R69 Illness, unspecified: Secondary | ICD-10-CM | POA: Diagnosis not present

## 2021-05-16 DIAGNOSIS — Z3042 Encounter for surveillance of injectable contraceptive: Secondary | ICD-10-CM | POA: Diagnosis not present

## 2021-05-16 DIAGNOSIS — G40909 Epilepsy, unspecified, not intractable, without status epilepticus: Secondary | ICD-10-CM | POA: Diagnosis not present

## 2021-05-23 DIAGNOSIS — R55 Syncope and collapse: Secondary | ICD-10-CM | POA: Diagnosis not present

## 2021-05-23 DIAGNOSIS — R569 Unspecified convulsions: Secondary | ICD-10-CM | POA: Diagnosis not present

## 2021-05-28 DIAGNOSIS — R569 Unspecified convulsions: Secondary | ICD-10-CM | POA: Diagnosis not present

## 2021-06-13 DIAGNOSIS — M25372 Other instability, left ankle: Secondary | ICD-10-CM | POA: Diagnosis not present

## 2021-06-18 DIAGNOSIS — N898 Other specified noninflammatory disorders of vagina: Secondary | ICD-10-CM | POA: Diagnosis not present

## 2021-06-30 ENCOUNTER — Ambulatory Visit (INDEPENDENT_AMBULATORY_CARE_PROVIDER_SITE_OTHER): Payer: Self-pay

## 2021-06-30 ENCOUNTER — Ambulatory Visit
Admission: EM | Admit: 2021-06-30 | Discharge: 2021-06-30 | Disposition: A | Discharge status: Home / Self Care | Payer: Worker's Compensation

## 2021-06-30 ENCOUNTER — Encounter: Payer: Self-pay | Admitting: Emergency Medicine

## 2021-06-30 ENCOUNTER — Ambulatory Visit: Payer: Self-pay | Attending: Physician Assistant

## 2021-06-30 ENCOUNTER — Other Ambulatory Visit: Payer: Self-pay

## 2021-06-30 DIAGNOSIS — M25522 Pain in left elbow: Secondary | ICD-10-CM

## 2021-06-30 DIAGNOSIS — G5622 Lesion of ulnar nerve, left upper limb: Secondary | ICD-10-CM

## 2021-06-30 DIAGNOSIS — M25512 Pain in left shoulder: Secondary | ICD-10-CM

## 2021-06-30 MED ORDER — NAPROXEN 500 MG PO TABS: 500.0000 mg | ORAL_TABLET | Freq: Two times a day (BID) | ORAL | 0 refills | Status: AC

## 2021-06-30 MED ORDER — TRAMADOL HCL 50 MG PO TABS: 50.0000 mg | ORAL_TABLET | Freq: Every day | ORAL | 0 refills | Status: AC | PRN

## 2021-06-30 NOTE — ED Triage Notes (Signed)
Patient states that she had a person fall off a ladder and land on her left shoulder while she was at work yesterday.  Patient reports numbness and tingling in the left arm.  Patient limited ROM in left shoulder.  ?

## 2021-06-30 NOTE — Discharge Instructions (Addendum)
ARM PAIN: Xrays are normal. Stressed avoiding painful activities . Reviewed RICE guidelines. Use medications as directed, including NSAIDs. If no NSAIDs have been prescribed for you today, you may take Aleve or Motrin over the counter. May use Tylenol in between doses of NSAIDs.   ? ?Follow up with Joanna Hews, NP ?[address] [place], [place] [address] ? ?Call to make appointment at [phone] ? ?Clinic hours: 8a-5p M-F  ?

## 2021-06-30 NOTE — ED Provider Notes (Signed)
?[facility] ? ? ? ?CSN: [id number] ?Arrival date & time: 06/30/21  1205 ? ? ?  ? ?History   ?Chief Complaint ?Chief Complaint  ?Patient presents with  ? Shoulder Pain  ?  left  ? Worker's Comp Injury  ? ? ?HPI ?Susan Velazquez is a 26 y.o. female presenting for left shoulder and elbow pain following an accident that occurred at work yesterday.  Patient says she was coming through the door and someone on a ladder fell onto her and the ladder fell onto her as well.  She says everything landed on her left arm.  Now she reports she has had numbness and tingling around the elbow and down to the fourth and fifth digits and half of the third digit.  Reports more pain in her elbow than her shoulder.  Reports reduced range of motion of elbow and shoulder.  Patient has taken over-the-counter medication without much improvement in symptoms.  She denies any other injuries.  No reports of head injury or loss of consciousness.  No other complaints. ? ?HPI ? ?Past Medical History:  ?Diagnosis Date  ? Anxiety   ? Depression   ? Hashimoto's disease   ? ? ?Patient Active Problem List  ? Diagnosis Date Noted  ? Major depressive disorder, recurrent severe without psychotic features (HCC) 02/23/2021  ? Anxiety 02/06/2021  ? ? ?Past Surgical History:  ?Procedure Laterality Date  ? KNEE SURGERY    ? ? ?OB History   ?No obstetric history on file. ?  ? ? ? ?Home Medications   ? ?Prior to Admission medications   ?Medication Sig Start Date End Date Taking? Authorizing Provider  ?Disulfiram 500 MG TABS Take by mouth. 05/02/21  Yes [provider]  ?escitalopram (LEXAPRO) 20 MG tablet Take 20 mg by mouth daily. 03/02/20  Yes [provider]  ?levothyroxine (SYNTHROID) 88 MCG tablet Take 88 mcg by mouth daily. 01/10/21  Yes [provider]  ?naproxen (NAPROSYN) 500 MG tablet Take 1 tablet (500 mg total) by mouth 2 (two) times daily for 10 days. 06/30/21 07/10/21 Yes Shirlee LatchEaves, Kainoah Bartosiewicz B, PA-C  ?traMADol (ULTRAM)  50 MG tablet Take 1 tablet (50 mg total) by mouth daily as needed for up to 5 days. 06/30/21 07/05/21 Yes Eusebio FriendlyEaves, Jaja Switalski B, PA-C  ?ondansetron (ZOFRAN ODT) 4 MG disintegrating tablet Take 1 tablet (4 mg total) by mouth every 8 (eight) hours as needed for nausea or vomiting. ?Patient not taking: Reported on 02/25/2021 09/29/20   Chesley NoonJessup, Charles, MD  ?SPRAVATO, 84 MG DOSE, 28 MG/DEVICE SOPK Place 3 sprays into the nose as directed. 01/17/21   [provider]  ?albuterol (VENTOLIN HFA) 108 (90 Base) MCG/ACT inhaler Inhale 2 puffs into the lungs every 6 (six) hours as needed for wheezing or shortness of breath.  01/31/20  [provider]  ?buPROPion (WELLBUTRIN XL) 150 MG 24 hr tablet Take 150 mg by mouth daily.  01/31/20  [provider]  ?gabapentin (NEURONTIN) 300 MG capsule Take 300 mg by mouth at bedtime.  01/31/20  [provider]  ? ? ?Family History ?History reviewed. No pertinent family history. ? ?Social History ?Social History  ? ?Tobacco Use  ? Smoking status: Some Days  ?  Types: Cigarettes  ? Smokeless tobacco: Never  ?Vaping Use  ? Vaping Use: Never used  ?Substance Use Topics  ? Alcohol use: Yes  ? Drug use: Never  ? ? ? ?Allergies   ?Cymbalta [duloxetine hcl] and Iodine ? ? ?  Review of Systems ?Review of Systems  ?Musculoskeletal:  Positive for arthralgias and joint swelling. Negative for neck pain and neck stiffness.  ?Skin:  Positive for color change.  ?Neurological:  Positive for numbness. Negative for syncope, weakness and headaches.  ? ? ?Physical Exam ?Triage Vital Signs ?ED Triage Vitals  ?Enc Vitals Group  ?   BP 06/30/21 1226 (!) 123/93  ?   Pulse Rate 06/30/21 1226 78  ?   Resp 06/30/21 1226 14  ?   Temp 06/30/21 1226 98.3 ?F (36.8 ?C)  ?   Temp Source 06/30/21 1226 Oral  ?   SpO2 06/30/21 1226 97 %  ?   Weight 06/30/21 1224 158 lb (71.7 kg)  ?   Height 06/30/21 1224 5\' 3"  (1.6 m)  ?   Head Circumference --   ?   Peak Flow --   ?   Pain Score 06/30/21 1224 4  ?   Pain  Loc --   ?   Pain Edu? --   ?   Excl. in GC? --   ? ?No data found. ? ?Updated Vital Signs ?BP (!) 123/93 (BP Location: Left Arm)   Pulse 78   Temp 98.3 ?F (36.8 ?C) (Oral)   Resp 14   Ht 5\' 3"  (1.6 m)   Wt 158 lb (71.7 kg)   LMP 06/16/2021 (Approximate)   SpO2 97%   BMI 27.99 kg/m?  ? ? ?Physical Exam ?Vitals and nursing note reviewed.  ?Constitutional:   ?   General: She is not in acute distress. ?   Appearance: Normal appearance. She is not ill-appearing or toxic-appearing.  ?HENT:  ?   Head: Normocephalic and atraumatic.  ?Eyes:  ?   General: No scleral icterus.    ?   Right eye: No discharge.     ?   Left eye: No discharge.  ?   Conjunctiva/sclera: Conjunctivae normal.  ?Cardiovascular:  ?   Rate and Rhythm: Normal rate and regular rhythm.  ?   Pulses: Normal pulses.  ?Pulmonary:  ?   Effort: Pulmonary effort is normal. No respiratory distress.  ?Musculoskeletal:  ?   Left shoulder: Swelling (mild swelling and ecchymosis left upper arm) and bony tenderness (TTP distal 1/3 clavicle, proximal humerus, AC joint) present. No deformity. Decreased range of motion (decreased to about 45 degrees of flexion and abduction). Normal strength.  ?   Left elbow: No swelling or effusion. Tenderness present in medial epicondyle.  ?   Left forearm: Normal.  ?   Left wrist: Normal.  ?   Left hand: Normal.  ?   Cervical back: Neck supple.  ?Skin: ?   General: Skin is dry.  ?Neurological:  ?   General: No focal deficit present.  ?   Mental Status: She is alert. Mental status is at baseline.  ?   Motor: No weakness.  ?   Coordination: Coordination normal.  ?   Gait: Gait normal.  ?Psychiatric:     ?   Mood and Affect: Mood normal.     ?   Behavior: Behavior normal.     ?   Thought Content: Thought content normal.  ? ? ? ?UC Treatments / Results  ?Labs ?(all labs ordered are listed, but only abnormal results are displayed) ?Labs Reviewed - No data to display ? ?EKG ? ? ?Radiology ?DG Elbow Complete Left ? ?Result Date:  06/30/2021 ?CLINICAL DATA:  Co-worker and ladder fell on patient. Now with numbness and tingling around  the elbow EXAM: LEFT ELBOW - COMPLETE 3+ VIEW COMPARISON:  None. FINDINGS: There is no evidence of fracture, dislocation, or joint effusion. There is no evidence of arthropathy or other focal bone abnormality. Soft tissues are unremarkable. IMPRESSION: Negative. Electronically Signed   By: Signa Kell M.D.   On: 06/30/2021 13:33  ? ?DG Shoulder Left ? ?Result Date: 06/30/2021 ?CLINICAL DATA:  Trauma, pain EXAM: LEFT SHOULDER - 2+ VIEW COMPARISON:  None. FINDINGS: There is no evidence of fracture or dislocation. There is no evidence of arthropathy or other focal bone abnormality. Soft tissues are unremarkable. IMPRESSION: No fracture or dislocation is seen in the left shoulder. Electronically Signed   By: Ernie Avena M.D.   On: 06/30/2021 13:01   ? ?Procedures ?Procedures (including critical care time) ? ?Medications Ordered in UC ?Medications - No data to display ? ?Initial Impression / Assessment and Plan / UC Course  ?I have reviewed the triage vital signs and the nursing notes. ? ?Pertinent labs & imaging results that were available during my care of the patient were reviewed by me and considered in my medical decision making (see chart for details). ? ?26 year old female presenting for left elbow and shoulder pain following an accident that occurred at work yesterday.  Patient says a ladder and another person fell onto her left arm.  She also now has reduced range of motion of her shoulder, elbow and numbness and tingling from her medial elbow to the third fourth and fifth digits. ? ?X-rays of shoulder and elbow obtained today.  X-rays negative for fracture. ? ?Discussed results of imaging with patient.  Advised her that she may have pulled some muscles in her shoulder.  She has pinched nerve at her elbow.  Advised that she has ulnar neuropathy.  Starting patient on naproxen.  Advised also following  RICE guidelines.  Patient given sling for support/comfort.  Work restrictions given. 5 tabs of ultram sent to pharmacy for severe pain.  Controlled Substance Database reviewed. Advised to follow-up with work comp provi

## 2021-07-25 ENCOUNTER — Emergency Department: Payer: 59

## 2021-07-25 ENCOUNTER — Inpatient Hospital Stay: Payer: 59

## 2021-07-25 ENCOUNTER — Other Ambulatory Visit: Payer: Self-pay

## 2021-07-25 ENCOUNTER — Observation Stay
Admission: EM | Admit: 2021-07-25 | Discharge: 2021-07-29 | Discharge status: Against Medical Advice | Payer: 59 | Attending: Internal Medicine | Admitting: Internal Medicine

## 2021-07-25 DIAGNOSIS — F4310 Post-traumatic stress disorder, unspecified: Secondary | ICD-10-CM | POA: Diagnosis present

## 2021-07-25 DIAGNOSIS — F909 Attention-deficit hyperactivity disorder, unspecified type: Secondary | ICD-10-CM | POA: Diagnosis present

## 2021-07-25 DIAGNOSIS — F419 Anxiety disorder, unspecified: Secondary | ICD-10-CM | POA: Diagnosis present

## 2021-07-25 DIAGNOSIS — R001 Bradycardia, unspecified: Secondary | ICD-10-CM | POA: Diagnosis not present

## 2021-07-25 DIAGNOSIS — G9341 Metabolic encephalopathy: Secondary | ICD-10-CM | POA: Insufficient documentation

## 2021-07-25 DIAGNOSIS — R6520 Severe sepsis without septic shock: Secondary | ICD-10-CM | POA: Diagnosis present

## 2021-07-25 DIAGNOSIS — G4700 Insomnia, unspecified: Secondary | ICD-10-CM | POA: Diagnosis present

## 2021-07-25 DIAGNOSIS — Z79899 Other long term (current) drug therapy: Secondary | ICD-10-CM | POA: Diagnosis not present

## 2021-07-25 DIAGNOSIS — Y92239 Unspecified place in hospital as the place of occurrence of the external cause: Secondary | ICD-10-CM | POA: Diagnosis not present

## 2021-07-25 DIAGNOSIS — E063 Autoimmune thyroiditis: Secondary | ICD-10-CM | POA: Diagnosis not present

## 2021-07-25 DIAGNOSIS — Z9151 Personal history of suicidal behavior: Secondary | ICD-10-CM

## 2021-07-25 DIAGNOSIS — F17210 Nicotine dependence, cigarettes, uncomplicated: Secondary | ICD-10-CM | POA: Insufficient documentation

## 2021-07-25 DIAGNOSIS — Z888 Allergy status to other drugs, medicaments and biological substances status: Secondary | ICD-10-CM

## 2021-07-25 DIAGNOSIS — Z20822 Contact with and (suspected) exposure to covid-19: Secondary | ICD-10-CM | POA: Insufficient documentation

## 2021-07-25 DIAGNOSIS — Z5329 Procedure and treatment not carried out because of patient's decision for other reasons: Secondary | ICD-10-CM | POA: Diagnosis present

## 2021-07-25 DIAGNOSIS — F330 Major depressive disorder, recurrent, mild: Secondary | ICD-10-CM | POA: Insufficient documentation

## 2021-07-25 DIAGNOSIS — E669 Obesity, unspecified: Secondary | ICD-10-CM | POA: Diagnosis present

## 2021-07-25 DIAGNOSIS — G40909 Epilepsy, unspecified, not intractable, without status epilepticus: Secondary | ICD-10-CM | POA: Diagnosis not present

## 2021-07-25 DIAGNOSIS — Y92009 Unspecified place in unspecified non-institutional (private) residence as the place of occurrence of the external cause: Secondary | ICD-10-CM

## 2021-07-25 DIAGNOSIS — R569 Unspecified convulsions: Secondary | ICD-10-CM | POA: Diagnosis not present

## 2021-07-25 DIAGNOSIS — E872 Acidosis, unspecified: Secondary | ICD-10-CM | POA: Diagnosis present

## 2021-07-25 DIAGNOSIS — M545 Low back pain, unspecified: Secondary | ICD-10-CM | POA: Diagnosis not present

## 2021-07-25 DIAGNOSIS — Z79890 Hormone replacement therapy (postmenopausal): Secondary | ICD-10-CM | POA: Diagnosis not present

## 2021-07-25 DIAGNOSIS — T447X5A Adverse effect of beta-adrenoreceptor antagonists, initial encounter: Secondary | ICD-10-CM | POA: Diagnosis not present

## 2021-07-25 DIAGNOSIS — T421X6A Underdosing of iminostilbenes, initial encounter: Secondary | ICD-10-CM | POA: Diagnosis present

## 2021-07-25 DIAGNOSIS — Z91148 Patient's other noncompliance with medication regimen for other reason: Secondary | ICD-10-CM | POA: Insufficient documentation

## 2021-07-25 DIAGNOSIS — E0591 Thyrotoxicosis, unspecified with thyrotoxic crisis or storm: Principal | ICD-10-CM | POA: Insufficient documentation

## 2021-07-25 DIAGNOSIS — Z6831 Body mass index (BMI) 31.0-31.9, adult: Secondary | ICD-10-CM | POA: Diagnosis not present

## 2021-07-25 LAB — URINALYSIS, ROUTINE W REFLEX MICROSCOPIC
Bacteria, UA: NONE SEEN
Bilirubin Urine: NEGATIVE
Glucose, UA: NEGATIVE mg/dL
Hgb urine dipstick: NEGATIVE
Ketones, ur: NEGATIVE mg/dL
Nitrite: NEGATIVE
Protein, ur: NEGATIVE mg/dL
Specific Gravity, Urine: 1.011 (ref 1.005–1.030)
pH: 6 (ref 5.0–8.0)

## 2021-07-25 LAB — COMPREHENSIVE METABOLIC PANEL
ALT: 26 U/L (ref 0–44)
AST: 33 U/L (ref 15–41)
Albumin: 4.1 g/dL (ref 3.5–5.0)
Alkaline Phosphatase: 79 U/L (ref 38–126)
Anion gap: 14 (ref 5–15)
BUN: 13 mg/dL (ref 6–20)
CO2: 18 mmol/L — ABNORMAL LOW (ref 22–32)
Calcium: 9.6 mg/dL (ref 8.9–10.3)
Chloride: 102 mmol/L (ref 98–111)
Creatinine, Ser: 0.71 mg/dL (ref 0.44–1.00)
GFR, Estimated: >60 mL/min (ref >60)
Glucose, Bld: 107 mg/dL — ABNORMAL HIGH (ref 70–99)
Potassium: 3.9 mmol/L (ref 3.5–5.1)
Sodium: 134 mmol/L — ABNORMAL LOW (ref 135–145)
Total Bilirubin: 0.5 mg/dL (ref 0.3–1.2)
Total Protein: 8.6 g/dL — ABNORMAL HIGH (ref 6.5–8.1)

## 2021-07-25 LAB — CORTISOL: Cortisol, Plasma: 6.7 ug/dL

## 2021-07-25 LAB — CBC WITH DIFFERENTIAL/PLATELET
Abs Immature Granulocytes: 0.04 10*3/uL (ref 0.00–0.07)
Basophils Absolute: 0.1 10*3/uL (ref 0.0–0.1)
Basophils Relative: 1 %
Eosinophils Absolute: 0.4 10*3/uL (ref 0.0–0.5)
Eosinophils Relative: 4 %
HCT: 39.4 % (ref 36.0–46.0)
Hemoglobin: 13.4 g/dL (ref 12.0–15.0)
Immature Granulocytes: 0 %
Lymphocytes Relative: 22 %
Lymphs Abs: 2.2 10*3/uL (ref 0.7–4.0)
MCH: 27.6 pg (ref 26.0–34.0)
MCHC: 34 g/dL (ref 30.0–36.0)
MCV: 81.1 fL (ref 80.0–100.0)
Monocytes Absolute: 0.7 10*3/uL (ref 0.1–1.0)
Monocytes Relative: 7 %
Neutro Abs: 6.6 10*3/uL (ref 1.7–7.7)
Neutrophils Relative %: 66 %
Platelets: 311 10*3/uL (ref 150–400)
RBC: 4.86 MIL/uL (ref 3.87–5.11)
RDW: 12.2 % (ref 11.5–15.5)
WBC: 10 10*3/uL (ref 4.0–10.5)
nRBC: 0 % (ref 0.0–0.2)

## 2021-07-25 LAB — URINE DRUG SCREEN, QUALITATIVE (ARMC ONLY)
Amphetamines, Ur Screen: NOT DETECTED
Barbiturates, Ur Screen: NOT DETECTED
Benzodiazepine, Ur Scrn: POSITIVE — AB
Cannabinoid 50 Ng, Ur ~~LOC~~: NOT DETECTED
Cocaine Metabolite,Ur ~~LOC~~: NOT DETECTED
MDMA (Ecstasy)Ur Screen: NOT DETECTED
Methadone Scn, Ur: NOT DETECTED
Opiate, Ur Screen: NOT DETECTED
Phencyclidine (PCP) Ur S: NOT DETECTED
Tricyclic, Ur Screen: NOT DETECTED

## 2021-07-25 LAB — RESP PANEL BY RT-PCR (FLU A&B, COVID) ARPGX2
Influenza A by PCR: NEGATIVE
Influenza B by PCR: NEGATIVE
SARS Coronavirus 2 by RT PCR: NEGATIVE

## 2021-07-25 LAB — LACTIC ACID, PLASMA
Lactic Acid, Venous: 7.4 mmol/L (ref 0.5–1.9)
Lactic Acid, Venous: 4.8 mmol/L (ref 0.5–1.9)
Lactic Acid, Venous: 1.4 mmol/L (ref 0.5–1.9)

## 2021-07-25 LAB — ETHANOL: Alcohol, Ethyl (B): <10 mg/dL (ref <10)

## 2021-07-25 LAB — POC URINE PREG, ED: Preg Test, Ur: NEGATIVE

## 2021-07-25 LAB — T4, FREE: Free T4: 4.32 ng/dL — ABNORMAL HIGH (ref 0.61–1.12)

## 2021-07-25 LAB — TSH: TSH: 0.028 u[IU]/mL — ABNORMAL LOW (ref 0.350–4.500)

## 2021-07-25 LAB — GLUCOSE, CAPILLARY: Glucose-Capillary: 108 mg/dL — ABNORMAL HIGH (ref 70–99)

## 2021-07-25 LAB — HIV ANTIBODY (ROUTINE TESTING W REFLEX): HIV Screen 4th Generation wRfx: NONREACTIVE

## 2021-07-25 MED ORDER — FAMOTIDINE IN NACL 20-0.9 MG/50ML-% IV SOLN
20.0000 mg | Freq: Two times a day (BID) | INTRAVENOUS | Status: DC
  Administered 2021-07-25 – 2021-07-28 (×6): 20 mg via INTRAVENOUS
  Filled 2021-07-25 (×6): qty 50

## 2021-07-25 MED ORDER — DOCUSATE SODIUM 100 MG PO CAPS: 100.0000 mg | ORAL_CAPSULE | Freq: Two times a day (BID) | ORAL | Status: DC | PRN

## 2021-07-25 MED ORDER — SODIUM CHLORIDE 0.9 % IV SOLN: 250.0000 mL | INTRAVENOUS | Status: DC | PRN

## 2021-07-25 MED ORDER — METHIMAZOLE 10 MG PO TABS
20.0000 mg | ORAL_TABLET | Freq: Once | ORAL | Status: AC
  Administered 2021-07-25: 20 mg via ORAL
  Filled 2021-07-25: qty 2

## 2021-07-25 MED ORDER — DEXTROSE 5 % IV SOLN
10.0000 mg/kg | Freq: Three times a day (TID) | INTRAVENOUS | Status: DC
  Administered 2021-07-25 – 2021-07-27 (×6): 775 mg via INTRAVENOUS
  Filled 2021-07-25 (×7): qty 15.5

## 2021-07-25 MED ORDER — SODIUM CHLORIDE 0.9 % IV SOLN: 2.0000 g | INTRAVENOUS | Status: DC

## 2021-07-25 MED ORDER — SODIUM CHLORIDE 0.9% FLUSH
3.0000 mL | Freq: Two times a day (BID) | INTRAVENOUS | Status: DC
  Administered 2021-07-25 – 2021-07-28 (×6): 3 mL via INTRAVENOUS

## 2021-07-25 MED ORDER — KETOROLAC TROMETHAMINE 30 MG/ML IJ SOLN
30.0000 mg | Freq: Once | INTRAMUSCULAR | Status: AC
  Administered 2021-07-25: 30 mg via INTRAVENOUS
  Filled 2021-07-25: qty 1

## 2021-07-25 MED ORDER — PROPRANOLOL HCL 40 MG PO TABS
60.0000 mg | ORAL_TABLET | Freq: Four times a day (QID) | ORAL | Status: DC
  Administered 2021-07-26 – 2021-07-28 (×9): 60 mg via ORAL
  Filled 2021-07-25: qty 3
  Filled 2021-07-25: qty 1
  Filled 2021-07-25 (×2): qty 3
  Filled 2021-07-25: qty 1
  Filled 2021-07-25: qty 3
  Filled 2021-07-25 (×2): qty 1
  Filled 2021-07-25: qty 3
  Filled 2021-07-25: qty 1
  Filled 2021-07-25: qty 3
  Filled 2021-07-25: qty 1

## 2021-07-25 MED ORDER — HYDROCORTISONE SOD SUC (PF) 500 MG IJ SOLR
300.0000 mg | Freq: Once | INTRAMUSCULAR | Status: DC
Start: 2021-07-25 — End: 2021-07-25
  Filled 2021-07-25: qty 2.4

## 2021-07-25 MED ORDER — METHIMAZOLE 10 MG PO TABS
20.0000 mg | ORAL_TABLET | Freq: Three times a day (TID) | ORAL | Status: DC
  Administered 2021-07-25 – 2021-07-29 (×11): 20 mg via ORAL
  Filled 2021-07-25 (×12): qty 2

## 2021-07-25 MED ORDER — PROPRANOLOL HCL 20 MG PO TABS
60.0000 mg | ORAL_TABLET | Freq: Once | ORAL | Status: AC
  Administered 2021-07-25: 60 mg via ORAL
  Filled 2021-07-25: qty 3

## 2021-07-25 MED ORDER — ACETAMINOPHEN 325 MG PO TABS
650.0000 mg | ORAL_TABLET | ORAL | Status: DC | PRN
  Administered 2021-07-25 – 2021-07-28 (×3): 650 mg via ORAL
  Filled 2021-07-25 (×3): qty 2

## 2021-07-25 MED ORDER — POLYETHYLENE GLYCOL 3350 17 G PO PACK: 17.0000 g | PACK | Freq: Every day | ORAL | Status: DC | PRN

## 2021-07-25 MED ORDER — SODIUM CHLORIDE 0.9 % IV BOLUS
1000.0000 mL | Freq: Once | INTRAVENOUS | Status: AC
  Administered 2021-07-25: 1000 mL via INTRAVENOUS

## 2021-07-25 MED ORDER — VANCOMYCIN HCL 1750 MG/350ML IV SOLN
1750.0000 mg | Freq: Once | INTRAVENOUS | Status: AC
  Administered 2021-07-25: 1750 mg via INTRAVENOUS
  Filled 2021-07-25: qty 350

## 2021-07-25 MED ORDER — LACTATED RINGERS IV SOLN
125.0000 mL/h | INTRAVENOUS | Status: DC
  Administered 2021-07-25 – 2021-07-27 (×4): 125 mL/h via INTRAVENOUS

## 2021-07-25 MED ORDER — SODIUM CHLORIDE 0.9 % IV SOLN
2.0000 g | Freq: Two times a day (BID) | INTRAVENOUS | Status: DC
  Administered 2021-07-26 (×2): 2 g via INTRAVENOUS
  Filled 2021-07-25: qty 20
  Filled 2021-07-25: qty 2
  Filled 2021-07-25: qty 20

## 2021-07-25 MED ORDER — LEVETIRACETAM IN NACL 500 MG/100ML IV SOLN
500.0000 mg | Freq: Two times a day (BID) | INTRAVENOUS | Status: DC
  Administered 2021-07-25 – 2021-07-28 (×6): 500 mg via INTRAVENOUS
  Filled 2021-07-25 (×7): qty 100

## 2021-07-25 MED ORDER — SODIUM CHLORIDE 0.9 % IV SOLN
2.0000 g | Freq: Once | INTRAVENOUS | Status: AC
  Administered 2021-07-25: 2 g via INTRAVENOUS
  Filled 2021-07-25: qty 20

## 2021-07-25 MED ORDER — HYDROCORTISONE SOD SUC (PF) 100 MG IJ SOLR
100.0000 mg | Freq: Three times a day (TID) | INTRAMUSCULAR | Status: DC
  Administered 2021-07-26 – 2021-07-27 (×5): 100 mg via INTRAVENOUS
  Filled 2021-07-25 (×7): qty 2

## 2021-07-25 MED ORDER — LIDOCAINE HCL 1 % IJ SOLN
5.0000 mL | Freq: Once | INTRAMUSCULAR | Status: AC
  Administered 2021-07-25: 5 mL via INTRADERMAL
  Filled 2021-07-25: qty 10

## 2021-07-25 MED ORDER — ONDANSETRON HCL 4 MG/2ML IJ SOLN
4.0000 mg | Freq: Four times a day (QID) | INTRAMUSCULAR | Status: DC | PRN
  Administered 2021-07-26 – 2021-07-29 (×7): 4 mg via INTRAVENOUS
  Filled 2021-07-25 (×7): qty 2

## 2021-07-25 MED ORDER — HYDROCORTISONE SOD SUC (PF) 250 MG IJ SOLR
250.0000 mg | Freq: Once | INTRAMUSCULAR | Status: AC
  Administered 2021-07-25: 250 mg via INTRAVENOUS
  Filled 2021-07-25: qty 250

## 2021-07-25 MED ORDER — HYDROCORTISONE SOD SUC (PF) 250 MG IJ SOLR: 250.0000 mg | Freq: Once | INTRAMUSCULAR | Status: DC

## 2021-07-25 MED ORDER — SODIUM CHLORIDE 0.9% FLUSH: 3.0000 mL | INTRAVENOUS | Status: DC | PRN

## 2021-07-25 MED ORDER — VANCOMYCIN HCL IN DEXTROSE 1-5 GM/200ML-% IV SOLN
1000.0000 mg | Freq: Two times a day (BID) | INTRAVENOUS | Status: DC
Start: 2021-07-26 — End: 2021-07-26
  Administered 2021-07-26: 1000 mg via INTRAVENOUS
  Filled 2021-07-25 (×2): qty 200

## 2021-07-25 MED ORDER — VANCOMYCIN HCL IN DEXTROSE 1-5 GM/200ML-% IV SOLN: 1000.0000 mg | Freq: Once | INTRAVENOUS | Status: DC

## 2021-07-25 NOTE — ED Provider Notes (Signed)
? ?[facility] ?Provider Note ? ? ? Event Date/Time  ? First MD Initiated Contact with Patient 07/25/21 1503   ?  (approximate) ? ? ?History  ? ?Seizures ? ? ?HPI ? ?Susan Velazquez is a 26 y.o. female with a history of hashimoto thyroiditis, anxiety, depression, PTSD, syncope and possible seizure disorder who presents with multiple episodes of syncope and possible seizures.  The patient states that over the last few days she has had a headache and was feeling unwell, including lightheadedness as well as some difficulty walking and problems moving her right leg.  She states that today she had multiple back-to-back episodes of syncope.  She has had similar episodes in the past, but these were more intense and frequent.  After this, in the presence of EMS, she developed seizure-like activity and had 8-10 brief seizure-like episodes.  She received a total of 10 mg of Versed from EMS.  The patient states that she continues to feel weird and has a mild headache.  She said she was prescribed Trileptal for seizures but has not taken it in a while. ? ? ? ?Physical Exam  ? ?Triage Vital Signs: ?ED Triage Vitals [07/25/21 1511]  ?Enc Vitals Group  ?   BP 109/75  ?   Pulse Rate (!) 133  ?   Resp 16  ?   Temp 99.4 ?F (37.4 ?C)  ?   Temp Source Oral  ?   SpO2 92 %  ?   Weight   ?   Height   ?   Head Circumference   ?   Peak Flow   ?   Pain Score   ?   Pain Loc   ?   Pain Edu?   ?   Excl. in GC?   ? ? ?Most recent vital signs: ?Vitals:  ? 07/25/21 1830 07/25/21 1845  ?BP: 115/72   ?Pulse: 94   ?Resp: (!) 22 (!) 22  ?Temp:    ?SpO2: 100%   ? ? ? ?General: Awake, sleepy appearing. ?CV:  Good peripheral perfusion.  Normal heart sounds. ?Resp:  Normal effort.  Lungs CTAB. ?Abd:  Soft and nontender.  No distention.  ?Other:  EOMI.  PERRLA.  No facial droop.  No dysarthria.  Motor intact in all extremities.  No ataxia. ? ? ?ED Results / Procedures / Treatments  ? ?Labs ?(all labs ordered are listed, but only  abnormal results are displayed) ?Labs Reviewed  ?COMPREHENSIVE METABOLIC PANEL - Abnormal; Notable for the following components:  ?    Result Value  ? Sodium 134 (*)   ? CO2 18 (*)   ? Glucose, Bld 107 (*)   ? Total Protein 8.6 (*)   ? All other components within normal limits  ?LACTIC ACID, PLASMA - Abnormal; Notable for the following components:  ? Lactic Acid, Venous 7.4 (*)   ? All other components within normal limits  ?URINALYSIS, ROUTINE W REFLEX MICROSCOPIC - Abnormal; Notable for the following components:  ? Color, Urine STRAW (*)   ? APPearance CLEAR (*)   ? Leukocytes,Ua SMALL (*)   ? All other components within normal limits  ?URINE DRUG SCREEN, QUALITATIVE ([facility] ONLY) - Abnormal; Notable for the following components:  ? Benzodiazepine, Ur Scrn POSITIVE (*)   ? All other components within normal limits  ?TSH - Abnormal; Notable for the following components:  ? TSH 0.028 (*)   ? All other components within normal limits  ?T4, FREE -  Abnormal; Notable for the following components:  ? Free T4 4.32 (*)   ? All other components within normal limits  ?LACTIC ACID, PLASMA - Abnormal; Notable for the following components:  ? Lactic Acid, Venous 4.8 (*)   ? All other components within normal limits  ?RESP PANEL BY RT-PCR (FLU A&B, COVID) [id number]  ?CULTURE, BLOOD (ROUTINE X 2)  ?CULTURE, BLOOD (ROUTINE X 2)  ?CSF CULTURE W GRAM STAIN  ?URINE CULTURE  ?CBC WITH DIFFERENTIAL/PLATELET  ?ETHANOL  ?T3, FREE  ?LACTIC ACID, PLASMA  ?HIV ANTIBODY (ROUTINE TESTING W REFLEX)  ?CORTISOL  ?PROCALCITONIN  ?PROCALCITONIN  ?CBC  ?BASIC METABOLIC PANEL  ?CSF CELL COUNT WITH DIFFERENTIAL  ?CSF CELL COUNT WITH DIFFERENTIAL  ?PROTEIN AND GLUCOSE, CSF  ?HSV 1/2 PCR, CSF  ?POC URINE PREG, ED  ? ? ? ?EKG ? ?ED ECG REPORT ?IDionne Bucy, Nyeli Holtmeyer, the attending physician, personally viewed and interpreted this ECG. ? ?Date: 07/25/2021 ?EKG Time: 1511 ?Rate: 129 ?Rhythm: Sinus tachycardia ?QRS Axis: normal ?Intervals: normal ?ST/T Wave  abnormalities: normal ?Narrative Interpretation: no evidence of acute ischemia ? ? ? ?RADIOLOGY ? ?CT head: I independently viewed and interpreted the images; there is no ICH or other acute abnormality ? ?PROCEDURES: ? ?Critical Care performed: Yes, see critical care procedure note(s) ? ?.Critical Care ?Performed by: Dionne BucySiadecki, Kennon Encinas, MD ?Authorized by: Dionne BucySiadecki, Asiya Cutbirth, MD  ? ?Critical care provider statement:  ?  Critical care time (minutes):  30 ?  Critical care was necessary to treat or prevent imminent or life-threatening deterioration of the following conditions:  CNS failure or compromise ?  Critical care was time spent personally by me on the following activities:  Development of treatment plan with patient or surrogate, discussions with consultants, evaluation of patient's response to treatment, examination of patient, ordering and review of laboratory studies, ordering and review of radiographic studies, ordering and performing treatments and interventions, pulse oximetry, re-evaluation of patient's condition and review of old charts ?  Care discussed with: admitting provider   ?.Lumbar Puncture ? ?Date/Time: 07/25/2021 7:07 PM ?Performed by: Dionne BucySiadecki, Jennet Scroggin, MD ?Authorized by: Dionne BucySiadecki, Loris Seelye, MD  ? ?Consent:  ?  Consent obtained:  Written ?  Consent given by:  Patient ?  Risks, benefits, and alternatives were discussed: yes   ?  Risks discussed:  Bleeding, infection, pain, headache and nerve damage ?  Alternatives discussed:  Observation ?Universal protocol:  ?  Procedure explained and questions answered to patient or proxy's satisfaction: yes   ?  Relevant documents present and verified: yes   ?  Test results available: yes   ?  Immediately prior to procedure a time out was called: yes   ?  Site/side marked: yes   ?  Patient identity confirmed:  Verbally with patient ?Pre-procedure details:  ?  Procedure purpose:  Diagnostic ?  Preparation: Patient was prepped and draped in usual sterile  fashion   ?Sedation:  ?  Sedation type:  None ?Anesthesia:  ?  Anesthesia method:  Local infiltration ?  Local anesthetic:  Lidocaine 1% w/o epi ?Procedure details:  ?  Lumbar space:  L3-L4 interspace ?  Patient position:  R lateral decubitus ?  Needle gauge:  20 ?  Number of attempts:  1 ?  Total volume (ml):  0 ?Post-procedure details:  ?  Procedure completion:  Procedure terminated electively by provider ?Comments:  ?   During LP attempt, the patient reported increased pain despite adequate local anesthesia, then became extremely anxious and tearful.  Based on  discussion with the patient, procedure was terminated at that time.  Pain improved afterwards, and the patient returned to baseline.  She otherwise tolerated the procedure well. ? ? ?MEDICATIONS ORDERED IN ED: ?Medications  ?vancomycin (VANCOREADY) IVPB 1750 mg/350 mL (1,750 mg Intravenous New Bag/Given 07/25/21 1719)  ?sodium chloride flush (NS) 0.9 % injection 3 mL (has no administration in time range)  ?sodium chloride flush (NS) 0.9 % injection 3 mL (has no administration in time range)  ?0.9 %  sodium chloride infusion (has no administration in time range)  ?acetaminophen (TYLENOL) tablet 650 mg (has no administration in time range)  ?docusate sodium (COLACE) capsule 100 mg (has no administration in time range)  ?polyethylene glycol (MIRALAX / GLYCOLAX) packet 17 g (has no administration in time range)  ?ondansetron (ZOFRAN) injection 4 mg (has no administration in time range)  ?famotidine (PEPCID) IVPB 20 mg premix (has no administration in time range)  ?cefTRIAXone (ROCEPHIN) 2 g in sodium chloride 0.9 % 100 mL IVPB (has no administration in time range)  ?hydrocortisone sodium succinate (SOLU-CORTEF) 100 MG injection 100 mg (has no administration in time range)  ?methimazole (TAPAZOLE) tablet 20 mg (has no administration in time range)  ?propranolol (INDERAL) tablet 60 mg (has no administration in time range)  ?lactated ringers infusion (has no  administration in time range)  ?acyclovir (ZOVIRAX) 775 mg in dextrose 5 % 150 mL IVPB (has no administration in time range)  ?vancomycin (VANCOCIN) IVPB 1000 mg/200 mL premix (has no administration in time rang

## 2021-07-25 NOTE — Sepsis Progress Note (Signed)
eLink monitoring code sepsis.  

## 2021-07-25 NOTE — Progress Notes (Signed)
eLink Physician-Brief Progress Note ?Patient Name: Susan Velazquez ?DOB: 06-29-1995 ?MRN: [id number] ? ? ?Date of Service ? 07/25/2021  ?HPI/Events of Note ? Patient with a history of Hashimoto thyroiditis and suspected seizure disorder admitted with light headedness, recurrent syncopal episodes, and concern for seizures, work up is in progress.  ?eICU Interventions ? New Patient Evaluation.  ? ? ? ?  ? ?Kerry Kass Dale Strausser ?07/25/2021, 9:13 PM ?

## 2021-07-25 NOTE — Progress Notes (Signed)
PHARMACY -  BRIEF ANTIBIOTIC NOTE  ? ?Pharmacy has received consult(s) for vancomycin from an ED provider.  The patient's profile has been reviewed for ht/wt/allergies/indication/available labs.   ? ?One time order(s) placed for vancomycin 1750 mg IV ? ?Further antibiotics/pharmacy consults should be ordered by admitting physician if indicated.       ?                ?Thank you, ?Balbina Depace O Shelda Truby ?07/25/2021  4:07 PM ? ?

## 2021-07-25 NOTE — H&P (Signed)
? ?NAME:  Susan Velazquez, MRN:  [id number], DOB:  08/24/1995, LOS: 0 ?ADMISSION DATE:  07/25/2021, CONSULTATION DATE:  07/25/2021 ?REFERRING MD:  Dr. Cherylann Banas, CHIEF COMPLAINT:  Syncope, seizures  ? ?Brief Pt Description / Synopsis:  ?26 year old female admitted with acute metabolic encephalopathy and seizure-like activity in the setting of questionable meningitis and thyroid storm. ? ?History of Present Illness:  ?Susan Velazquez is a 26 year old female with a past medical history significant for Hashimoto's thyroiditis, anxiety, depression, PTSD, CP, seizure disorder who presented to [facility] ED on 07/25/2021 due to multiple episodes of syncope and multiple seizure-like episodes. ?  ?The patient reported that over the past few days she has had headache, malaise, lightheadedness high fevers between 101 and 105 which was responsive to Tylenol, along with problems walking/moving her right leg.  EMS reports she developed seizure-like activity with them (described as generalized convulsions) of which she was subsequently given 10 mg of Versed in total.  Patient does report that she was prescribed Trileptal for seizures but she has not taken this medication in a while. Of note the patient was evaluated by neurology at [facility] on 2/23 for the recurrent syncopal episodes and concern for possible seizures.  She had an EEG which was normal.  She was seen at the [facility] ED with left upper extremity pain on 4/7.  She reports a recent history of an elbow dislocation.  The patient was last seen in the ED here on 02/22/2021 after an overdose of gabapentin Flexeril, and oxycodone.  She was evaluated and eventually cleared by psychiatry. ? ?ED Course: In the emergency department, the temperature was 37.4?C, the heart rate 133 beats/minute, the blood pressure 109/75 mm Hg, the respiratory rate 16 breaths/minute, and the oxygen saturation 92% on RA. Upon arrival to the ED she was still having convulsions and appeared somnolent, but  immediately woke up to sternal rub and was able to converse.  She was noted to be tachycardic with borderline elevated temperature, otherwise normal vital signs.  Neurological exam was non-focal. ?Pertinent Labs in Red/Diagnostics Findings: ?Na+/ K+: 134/3.9 ?Glucose: 107 ?  ?TSH: 0.028, was 27.277 about 9 months ago ?Free T4: 4.32 ?Free T3: pending ?PCT: pending ?Lactic acid:7.4  ?UA; +Small Leukocytes, UDS + Benzo administered as above otherwise negative ? ?Chest Xray: No acute abnormality ?CTH: No acute intracranial abnormality ? ?Patient given 2L of fluids and started on broad-spectrum antibiotics Vanco cefepime and Acyclovir for sepsis due to suspected meningitis. She also received 250 mg Solu-Cortef, 20 mg Methimazole, 60 mg propranolol. ED provider plans to perform LP.  PCCM is asked to admit the patient for further work-up and treatment of acute metabolic encephalopathy in the setting of suspected thyroid storm and questionable meningitis.  Will consult neurology. ?Past Medical History  ?Hashimoto's thyroiditis, anxiety, depression, PTSD, CP, seizure disorder  ? ?Significant Hospital Events   ?4/27:Admitted to ICU with acute metabolic encephalopathy in the setting of suspected thyroid storm and questionable meningitis ? ?Consults:  ?Neurology ? ?Procedures:  ?None ? ?Significant Diagnostic Tests:  ?4/27: Chest Xray> ?4/27: Noncontrast CT head ? ?Micro Data:  ?4/27: Urine>> ?4/27: Blood culture x2>> ?4/27: CSF>> ?4/27: SARS-CoV-2 and influenza PCR>> ? ?Antimicrobials:  ?Acyclovir 4/27>> ?Ceftriaxone 4/27>> ?Vancomycin 4/27>> ? ?OBJECTIVE  ?Blood pressure 104/71, pulse 99, temperature 98.5 ?F (36.9 ?C), temperature source Oral, resp. rate (!) 23, height 5\' 3"  (1.6 m), weight 90.9 kg, SpO2 99 %. ?   ?   ? ?Intake/Output Summary (Last 24 hours) at 07/25/2021 2248 ?  Last data filed at 07/25/2021 [id number] ?Gross per 24 hour  ?Intake 2450 ml  ?Output --  ?Net 2450 ml  ? ?Filed Weights  ? 07/25/21 1600 07/25/21 2000   ?Weight: 77.6 kg 90.9 kg  ? ?Physical Examination  ?GENERAL: 26 year-old critically ill patient lying in the bed with no acute distress.  ?EYES: Pupils equal, round, reactive to light and accommodation. No scleral icterus. Extraocular muscles intact.  ?HEENT: Head atraumatic, normocephalic. Oropharynx and nasopharynx clear.  ?NECK:  Supple, no jugular venous distention. No thyroid enlargement, no tenderness.  ?LUNGS: Normal breath sounds bilaterally, no wheezing, rales,rhonchi or crepitation. No use of accessory muscles of respiration.  ?CARDIOVASCULAR: S1, S2 normal. No murmurs, rubs, or gallops.  ?ABDOMEN: Soft, nontender, nondistended. Bowel sounds present. No organomegaly or mass.  ?EXTREMITIES: No pedal edema, cyanosis, or clubbing.  ?NEUROLOGIC: Cranial nerves II through XII are intact.  Muscle strength 5/5 in all extremities except left elbow brace present. Sensation intact. Gait not checked.  ?PSYCHIATRIC: The patient is alert and oriented x 3.  ?SKIN: No obvious rash, lesion, or ulcer.  ? ?Labs/imaging that I havepersonally reviewed  ?(right click and "Reselect all SmartList Selections" daily)  ? ?  ?Labs   ?CBC: ?Recent Labs  ?Lab 07/25/21 ?1511  ?WBC 10.0  ?NEUTROABS 6.6  ?HGB 13.4  ?HCT 39.4  ?MCV 81.1  ?PLT 311  ? ? ?Basic Metabolic Panel: ?Recent Labs  ?Lab 07/25/21 ?1511  ?NA 134*  ?K 3.9  ?CL 102  ?CO2 18*  ?GLUCOSE 107*  ?BUN 13  ?CREATININE 0.71  ?CALCIUM 9.6  ? ?GFR: ?Estimated Creatinine Clearance: 115.1 mL/min (by C-G formula based on SCr of 0.71 mg/dL). ?Recent Labs  ?Lab 07/25/21 ?1511 07/25/21 ?1806 07/25/21 ?2059  ?WBC 10.0  --   --   ?LATICACIDVEN 7.4* 4.8* 1.4  ? ? ?Liver Function Tests: ?Recent Labs  ?Lab 07/25/21 ?1511  ?AST 33  ?ALT 26  ?ALKPHOS 79  ?BILITOT 0.5  ?PROT 8.6*  ?ALBUMIN 4.1  ? ?No results for input(s): LIPASE, AMYLASE in the last 168 hours. ?No results for input(s): AMMONIA in the last 168 hours. ? ?ABG ?No results found for: PHART, PCO2ART, PO2ART, HCO3, TCO2,  ACIDBASEDEF, O2SAT  ? ?Coagulation Profile: ?No results for input(s): INR, PROTIME in the last 168 hours. ? ?Cardiac Enzymes: ?No results for input(s): CKTOTAL, CKMB, CKMBINDEX, TROPONINI in the last 168 hours. ? ?HbA1C: ?No results found for: HGBA1C ? ?CBG: ?Recent Labs  ?Lab 07/25/21 ?2002  ?GLUCAP 108*  ? ? ?Review of Systems:   ?Review of Systems  ?Constitutional: Negative.   ?HENT: Negative.    ?Eyes: Negative.   ?Respiratory: Negative.    ?Cardiovascular:  Positive for palpitations.  ?Gastrointestinal: Negative.   ?Genitourinary: Negative.   ?Musculoskeletal: Negative.   ?Skin: Negative.   ?Neurological:  Positive for seizures, loss of consciousness and headaches.  ?Psychiatric/Behavioral:  Positive for depression and suicidal ideas. The patient is nervous/anxious.   ? ? ?Past Medical History  ?She,  has a past medical history of Anxiety, Depression, and Hashimoto's disease.  ? ?Surgical History   ? ?Past Surgical History:  ?Procedure Laterality Date  ? KNEE SURGERY    ?  ? ?Social History  ? reports that she has been smoking cigarettes. She has never used smokeless tobacco. She reports current alcohol use. She reports that she does not use drugs.  ? ?Family History   ?Her family history is not on file.  ? ?Allergies ?Allergies  ?Allergen Reactions  ?  Duloxetine Hcl Other (See Comments)  ?  Suicidal ideation ?Other reaction(s): Other ?Suicidal ideation  ? Iodine Other (See Comments)  ?  "affects my thyroid"  ?  ? ?Home Medications  ?Prior to Admission medications   ?Medication Sig Start Date End Date Taking? Authorizing Provider  ?escitalopram (LEXAPRO) 20 MG tablet Take 20 mg by mouth daily. 03/02/20  Yes [provider]  ?gabapentin (NEURONTIN) 300 MG capsule Take 300 mg by mouth 3 (three) times daily. 07/19/21  Yes [provider]  ?levothyroxine (SYNTHROID) 75 MCG tablet Take 75 mcg by mouth daily. 07/24/21  Yes [provider]  ?Disulfiram 500 MG TABS Take by mouth. 05/02/21    [provider]  ?divalproex (DEPAKOTE ER) 500 MG 24 hr tablet Take 500 mg by mouth 3 (three) times daily. ?Patient not taking: Reported on 07/25/2021 06/26/21   [provider]  ?levothyroxine (SYNTHROID) 88

## 2021-07-25 NOTE — ED Triage Notes (Signed)
Pt [facility] EMS due to having multiple syncopal episodes today. Enroute to hospital pt had 4-5 witnessed seizures. Pt received 10mg  of versed enroute. Pt has hx of seizures. Per EMS, pt has been noncompliant with seizure meds.    ?

## 2021-07-25 NOTE — Consult Note (Addendum)
Pharmacy Antibiotic Note ? ?Susan Velazquez is a 26 y.o. female admitted on 07/25/2021 with multiple episodes of syncope and possible seizures. Critical care team concerned for possible meningitis vs encephalitis. Pharmacy has been consulted for Vancomycin and Acyclovir dosing. Patient has also been started on Rocephin. ? ?Plan: ?Acyclovir 775mg (~10mg /kg) Q 8 hours ? ?Vancomycin 1750mg  x 1 as loading dose followed by 1000 mg IV Q 12 hrs. Goal AUC 400-550. ?Expected AUC: 457.7 ?Expected Css: 12.4 ?SCr used: 0.8(actual 0.71) ? ? ?Weight: 77.6 kg (171 lb 1.2 oz) ? ?Temp (24hrs), Avg:99.5 ?F (37.5 ?C), Min:99.4 ?F (37.4 ?C), Max:99.5 ?F (37.5 ?C) ? ?Recent Labs  ?Lab 07/25/21 ?1511  ?WBC 10.0  ?CREATININE 0.71  ?LATICACIDVEN 7.4*  ?  ?Estimated Creatinine Clearance: 106.1 mL/min (by C-G formula based on SCr of 0.71 mg/dL).   ? ?Allergies  ?Allergen Reactions  ? Cymbalta [Duloxetine Hcl] Other (See Comments)  ?  Suicidal ideation  ? Iodine Other (See Comments)  ?  "affects my thyroid"  ? ? ?Antimicrobials this admission: ?Acyclovir 4/27 >>  ?Vanc 4/27 >>  ?Rocephin 4/27 >> ? ?Dose adjustments this admission: ?N/a ? ?Microbiology results: ?4/27 BCx: pending ?4/27 CSF cx: pending  ?4/27 Resp cx: sent  ? ? ?Thank you for allowing pharmacy to be a part of this patient?s care. ? ?5/27 ?07/25/2021 6:32 PM ? ?

## 2021-07-25 NOTE — Progress Notes (Signed)
CODE SEPSIS - PHARMACY COMMUNICATION ? ?**Broad Spectrum Antibiotics should be administered within 1 hour of Sepsis diagnosis** ? ?Time Code Sepsis Called/Page Received: 8416 ? ?Antibiotics Ordered: vancomycin, ceftriaxone ? ?Time of 1st antibiotic administration: 1635 ? ? ? ?Raiford Noble ,PharmD ?Clinical Pharmacist  ?07/25/2021  4:06 PM ? ?

## 2021-07-26 ENCOUNTER — Inpatient Hospital Stay: Payer: 59 | Admitting: Radiology

## 2021-07-26 DIAGNOSIS — R6520 Severe sepsis without septic shock: Secondary | ICD-10-CM

## 2021-07-26 DIAGNOSIS — A419 Sepsis, unspecified organism: Secondary | ICD-10-CM

## 2021-07-26 HISTORY — PX: IR FL GUIDED LOC OF NEEDLE/CATH TIP FOR SPINAL INJECTION LT: IMG2396

## 2021-07-26 LAB — CSF CELL COUNT WITH DIFFERENTIAL
Eosinophils, CSF: 0 %
Lymphs, CSF: 34 %
Monocyte-Macrophage-Spinal Fluid: 66 %
RBC Count, CSF: 2 /mm3 (ref 0–3)
Segmented Neutrophils-CSF: 0 %
Tube #: 3
WBC, CSF: 25 /mm3 (ref 0–5)

## 2021-07-26 LAB — PROCALCITONIN
Procalcitonin: <0.1 ng/mL
Procalcitonin: <0.1 ng/mL

## 2021-07-26 LAB — BASIC METABOLIC PANEL
Anion gap: 5 (ref 5–15)
BUN: 12 mg/dL (ref 6–20)
CO2: 23 mmol/L (ref 22–32)
Calcium: 9.3 mg/dL (ref 8.9–10.3)
Chloride: 110 mmol/L (ref 98–111)
Creatinine, Ser: 0.66 mg/dL (ref 0.44–1.00)
GFR, Estimated: >60 mL/min (ref >60)
Glucose, Bld: 139 mg/dL — ABNORMAL HIGH (ref 70–99)
Potassium: 4.2 mmol/L (ref 3.5–5.1)
Sodium: 138 mmol/L (ref 135–145)

## 2021-07-26 LAB — CBC
HCT: 34.4 % — ABNORMAL LOW (ref 36.0–46.0)
Hemoglobin: 11.7 g/dL — ABNORMAL LOW (ref 12.0–15.0)
MCH: 27.6 pg (ref 26.0–34.0)
MCHC: 34 g/dL (ref 30.0–36.0)
MCV: 81.1 fL (ref 80.0–100.0)
Platelets: 268 10*3/uL (ref 150–400)
RBC: 4.24 MIL/uL (ref 3.87–5.11)
RDW: 12.4 % (ref 11.5–15.5)
WBC: 9 10*3/uL (ref 4.0–10.5)
nRBC: 0 % (ref 0.0–0.2)

## 2021-07-26 LAB — T3, FREE: T3, Free: 14.2 pg/mL — ABNORMAL HIGH (ref 2.0–4.4)

## 2021-07-26 LAB — PROTEIN, CSF: Total  Protein, CSF: 22 mg/dL (ref 15–45)

## 2021-07-26 LAB — GLUCOSE, CSF: Glucose, CSF: 81 mg/dL — ABNORMAL HIGH (ref 40–70)

## 2021-07-26 MED ORDER — LIDOCAINE HCL 1 % IJ SOLN
INTRAMUSCULAR | Status: AC
  Filled 2021-07-26: qty 20

## 2021-07-26 MED ORDER — HYDROMORPHONE HCL 1 MG/ML IJ SOLN
INTRAMUSCULAR | Status: AC
  Filled 2021-07-26: qty 1

## 2021-07-26 MED ORDER — IBUPROFEN 400 MG PO TABS
800.0000 mg | ORAL_TABLET | Freq: Three times a day (TID) | ORAL | Status: DC
  Administered 2021-07-26 – 2021-07-28 (×7): 800 mg via ORAL
  Filled 2021-07-26 (×7): qty 2

## 2021-07-26 MED ORDER — HYDROMORPHONE HCL 1 MG/ML IJ SOLN
1.0000 mg | INTRAMUSCULAR | Status: DC | PRN
  Administered 2021-07-26 – 2021-07-28 (×8): 1 mg via INTRAVENOUS
  Filled 2021-07-26 (×7): qty 1

## 2021-07-26 MED ORDER — OXYCODONE-ACETAMINOPHEN 5-325 MG PO TABS
1.0000 | ORAL_TABLET | Freq: Four times a day (QID) | ORAL | Status: DC | PRN
  Administered 2021-07-26 – 2021-07-28 (×5): 1 via ORAL
  Filled 2021-07-26 (×5): qty 1

## 2021-07-26 MED ORDER — VANCOMYCIN HCL 1250 MG/250ML IV SOLN
1250.0000 mg | Freq: Three times a day (TID) | INTRAVENOUS | Status: DC
Start: 2021-07-26 — End: 2021-07-26
  Administered 2021-07-26: 1250 mg via INTRAVENOUS
  Filled 2021-07-26 (×2): qty 250

## 2021-07-26 MED ORDER — CHLORHEXIDINE GLUCONATE CLOTH 2 % EX PADS
6.0000 | MEDICATED_PAD | Freq: Every day | CUTANEOUS | Status: DC
  Administered 2021-07-26 – 2021-07-27 (×2): 6 via TOPICAL

## 2021-07-26 NOTE — Progress Notes (Signed)
Alerted by nursing to the patient's disclosure regarding taking "2 handfuls of gabapentin" on July 19, 2021. I discussed bedside with the patient. She confirmed that she took "about 20" tablets of gabapentin, 300 mg each, on the night of April 21 st because she has had difficulty sleeping and wanted to sleep. She reported that it had the opposite effect keeping her awake and feeling on edge. She said that the police department performed a welfare check on her that same evening after she had shared with a couple of friends her actions. EMS also evaluated her because the police were concerned she was slurring her words. Both cleared her to remain at home. ? ?She denied having any current thoughts, or recent thoughts in the last 2 weeks, of wanting to harm herself/ wanting to end her life. She admits to previous suicide attempts, and self injurious behavior, but denies that this recent event was such. She also admitted to discussing the event with her therapist this past week and talked about developing better coping mechanisms. We looked through her belongings together, verifying that the only things in her current possession were her clothes and some perfume. ? ?We discussed how taking medication incorrectly can be dangerous to the kidneys/ liver. We also discussed how specifically gabapentin could make it more likely for her to have seizures. She affirmed that she will not use that as a coping mechanism in the future. ?Also discussed having psychiatry evaluate her tomorrow, which she accepted. Will not place on suicidal precautions at this time as the patient denies suicidal ideation/ a plan. ? ? ?Domingo Pulse Rust-[place], AGACNP-BC ?Acute Care Nurse Practitioner ?[facility]  ? ?[phone] / [phone] ?Please see Amion for pager details.  ? ?

## 2021-07-26 NOTE — Progress Notes (Signed)
Pt revealed to RN while mother was out of the room that on 21April, the pt self-administered 25 tablets of 300mg  gabapentin.  She says she was feeling very anxious and wanted help resting.  The medications had the opposite effect for the duration of that night.  She wanted this shared in case it would affect the providers orders/decisions regarding her seizure events, hyperthyroidism, or headache. ?

## 2021-07-26 NOTE — Progress Notes (Signed)
PHARMACY CONSULT NOTE ? ?Pharmacy Consult for Electrolyte Monitoring and Replacement  ? ?Recent Labs: ?Potassium (mmol/L)  ?Date Value  ?07/26/2021 4.2  ? ?Calcium (mg/dL)  ?Date Value  ?07/26/2021 9.3  ? ?Albumin (g/dL)  ?Date Value  ?07/25/2021 4.1  ? ?Sodium (mmol/L)  ?Date Value  ?07/26/2021 138  ? ? ?Assessment: 26 y.o. female with a history of hashimoto thyroiditis, anxiety, depression, PTSD, syncope and possible seizure disorder who presents with multiple episodes of syncope and possible seizures. ? ?MIVF: lactated ringers infusion at 125 mL/hr ? ? ?Goal of Therapy:  ?Electrolytes WNL ? ?Plan:  ?No electrolyte replacement warranted today ?Recheck electrolytes in am ? ?Dallie Piles ,PharmD ?Clinical Pharmacist ?07/26/2021 7:02 AM ? ?

## 2021-07-26 NOTE — Consult Note (Signed)
Pharmacy Antibiotic Note ? ?Susan Velazquez is a 26 y.o. female admitted on 07/25/2021 with multiple episodes of syncope and possible seizures. Critical care team concerned for possible meningitis vs encephalitis. Pharmacy has been consulted for vancomycin and acyclovir dosing. Patient has also been started on Rocephin. Renal function is stable ? ?Plan: ? ?1) continue acyclovir 775mg  (~10mg /kg) Q 8 hours ? ?2) adjust vancomycin to 1250 mg IV every 8 hours  ?Ke: 0.1058 h-1 ?T1/2 6.5 h ?Css: 32.6/16.0 mcg/mL ?Daily renal function assessment while on IV vancomycin ? ? ?Height: 5\' 3"  (160 cm) ?Weight: 90.9 kg (200 lb 6.4 oz) ?IBW/kg (Calculated) : 52.4 ? ?Temp (24hrs), Avg:99 ?F (37.2 ?C), Min:98.5 ?F (36.9 ?C), Max:99.5 ?F (37.5 ?C) ? ?Recent Labs  ?Lab 07/25/21 ?1511 07/25/21 ?1806 07/25/21 ?2059 07/26/21 ?0451  ?WBC 10.0  --   --  9.0  ?CREATININE 0.71  --   --  0.66  ?LATICACIDVEN 7.4* 4.8* 1.4  --   ? ?  ?Estimated Creatinine Clearance: 115.1 mL/min (by C-G formula based on SCr of 0.66 mg/dL).   ? ?Allergies  ?Allergen Reactions  ? Duloxetine Hcl Other (See Comments)  ?  Suicidal ideation ?Other reaction(s): Other ?Suicidal ideation  ? Iodine Other (See Comments)  ?  "affects my thyroid"  ? ? ?Antimicrobials this admission: ?acyclovir 4/27 >>  ?vancomycin 4/27 >>  ?ceftriaxone 4/27 >> ? ?Microbiology results: ?4/27 BCx: NG < 12 h ?4/27 UCx pending ?4/27 CSF Cx: pending  ? ? ?Thank you for allowing pharmacy to be a part of this patient?s care. ? ?5/27 ?07/26/2021 7:03 AM ? ?

## 2021-07-26 NOTE — Procedures (Signed)
Vascular and Interventional Radiology Procedure Note ? ?Patient: Susan Velazquez ?DOB: 19-May-1995 ?Medical Record Number: [id number] ?Note Date/Time: 07/26/21 5:22 PM  ? ?Performing Physician: Roanna Banning, MD ?Assistant(s): None ? ?Diagnosis: Seizure workup ? ?Procedure: LUMBAR PUNCTURE ? ?Anesthesia: Local Anesthetic ?Complications: None ?Estimated Blood Loss:  0 mL ?Specimens: Sent for Cytology ? ?Findings:  ?Successful Fluoroscopy-guided of lumbar puncture. ?A total of 20 mL of CSF was obtained in 4 vials. ?Hemostasis of the tract was achieved using Manual Pressure. ? ?Plan: Bed rest for 4 hours. ? ?See detailed procedure note with images in PACS. ?The patient tolerated the procedure well without incident or complication and was returned to ICU in stable condition.  ? ? ?Roanna Banning, MD ?[facility] ?[facility] ? ? ?Pager. [phone] ?Clinic. [phone]  ?

## 2021-07-27 ENCOUNTER — Encounter: Payer: Self-pay | Admitting: Internal Medicine

## 2021-07-27 DIAGNOSIS — E0591 Thyrotoxicosis, unspecified with thyrotoxic crisis or storm: Secondary | ICD-10-CM | POA: Diagnosis present

## 2021-07-27 DIAGNOSIS — E0581 Other thyrotoxicosis with thyrotoxic crisis or storm: Secondary | ICD-10-CM

## 2021-07-27 LAB — PHOSPHORUS: Phosphorus: 2.7 mg/dL (ref 2.5–4.6)

## 2021-07-27 LAB — CBC
HCT: 31.2 % — ABNORMAL LOW (ref 36.0–46.0)
Hemoglobin: 10.4 g/dL — ABNORMAL LOW (ref 12.0–15.0)
MCH: 27.7 pg (ref 26.0–34.0)
MCHC: 33.3 g/dL (ref 30.0–36.0)
MCV: 83 fL (ref 80.0–100.0)
Platelets: 205 10*3/uL (ref 150–400)
RBC: 3.76 MIL/uL — ABNORMAL LOW (ref 3.87–5.11)
RDW: 12.7 % (ref 11.5–15.5)
WBC: 8 10*3/uL (ref 4.0–10.5)
nRBC: 0 % (ref 0.0–0.2)

## 2021-07-27 LAB — BASIC METABOLIC PANEL
Anion gap: 4 — ABNORMAL LOW (ref 5–15)
BUN: 11 mg/dL (ref 6–20)
CO2: 25 mmol/L (ref 22–32)
Calcium: 9 mg/dL (ref 8.9–10.3)
Chloride: 109 mmol/L (ref 98–111)
Creatinine, Ser: 0.61 mg/dL (ref 0.44–1.00)
GFR, Estimated: >60 mL/min (ref >60)
Glucose, Bld: 127 mg/dL — ABNORMAL HIGH (ref 70–99)
Potassium: 4 mmol/L (ref 3.5–5.1)
Sodium: 138 mmol/L (ref 135–145)

## 2021-07-27 LAB — PROCALCITONIN: Procalcitonin: <0.1 ng/mL

## 2021-07-27 LAB — MAGNESIUM: Magnesium: 1.8 mg/dL (ref 1.7–2.4)

## 2021-07-27 LAB — URINE CULTURE

## 2021-07-27 MED ORDER — CALCIUM CARBONATE ANTACID 500 MG PO CHEW
1.0000 | CHEWABLE_TABLET | Freq: Three times a day (TID) | ORAL | Status: DC | PRN
  Administered 2021-07-27: 200 mg via ORAL
  Filled 2021-07-27: qty 1

## 2021-07-27 MED ORDER — DEXAMETHASONE SODIUM PHOSPHATE 4 MG/ML IJ SOLN
2.0000 mg | Freq: Four times a day (QID) | INTRAMUSCULAR | Status: DC
Start: 2021-07-27 — End: 2021-07-28
  Administered 2021-07-27 – 2021-07-28 (×3): 2 mg via INTRAVENOUS
  Filled 2021-07-27 (×3): qty 1

## 2021-07-27 MED ORDER — TRAZODONE HCL 50 MG PO TABS
50.0000 mg | ORAL_TABLET | Freq: Every day | ORAL | Status: DC
  Administered 2021-07-27: 50 mg via ORAL
  Filled 2021-07-27: qty 1

## 2021-07-27 NOTE — Progress Notes (Signed)
? ? ? ?Progress Note  ? ? Susan Velazquez?Susan Velazquez  HYQ:[id number] DOB: 02/22/1996  DOA: 07/25/2021 ?PCP: Ofilia Neaslark, Michael L, PA-C  ? ? ? ? ?Brief Narrative:  ? ? ?Medical records reviewed and are as summarized below: ? ?Susan Velazquez is a 26 y.o. female with past medical history significant for Hashimoto's thyroiditis, anxiety, depression, PTSD, CP, seizure disorder who presented to [facility] ED on 07/25/2021 due to multiple episodes of syncope and multiple seizure-like episodes.  Her Synthroid was recently decreased from 88 mcg to 75 mcg daily because her TSH was low.  She was supposed to go back for follow-up in May 2023. ?  ? ?She was admitted to the hospital for thyroid storm/hyperthyroidism. ? ? ? ? ?Assessment/Plan:  ? ?Principal Problem: ?  Thyroid storm ? ? ? ?Body mass index is 31.59 kg/m?.  (Obesity) ? ? ?Hyperthyroidism, thyroid storm in a patient with history of Hashimoto's thyroiditis: Continue IV hydrocortisone, methimazole and propranolol.  Discontinue IV fluids ? ?No evidence of sepsis or meningitis.  Discontinue IV acyclovir ? ?Acute metabolic encephalopathy: Improved ? ?Seizure disorder: She saw Dr. Chestine Sporelark in the office on 05/16/2021.  She is on Trileptal but it appears she is medically nonadherent.  Consult Dr. Selina CooleyStack, neurologist to assist with management.  Continue IV Keppra for now. ? ?Depression: Patient states she is not depressed and she is not suicidal. ? ?Insomnia: Trazodone for sleep at night ? ?Diet Order   ? ?       ?  Diet regular Room service appropriate? Yes; Fluid consistency: Thin  Diet effective now       ?  ? ?  ?  ? ?  ? ? ? ? ? ? ? ?Consultants: ?Intensivist ? ?Procedures: ?None ? ? ? ?Medications:  ? ? Chlorhexidine Gluconate Cloth  6 each Topical Daily  ? hydrocortisone sod succinate (SOLU-CORTEF) inj  100 mg Intravenous Q8H  ? ibuprofen  800 mg Oral TID  ? methimazole  20 mg Oral TID  ? propranolol  60 mg Oral QID  ? sodium chloride flush  3 mL Intravenous Q12H  ? traZODone  50  mg Oral QHS  ? ?Continuous Infusions: ? sodium chloride    ? famotidine (PEPCID) IV 20 mg (07/27/21 1000)  ? levETIRAcetam 500 mg (07/27/21 0800)  ? ? ? ?Anti-infectives (From admission, onward)  ? ? Start     Dose/Rate Route Frequency Ordered Stop  ? 07/26/21 1800  vancomycin (VANCOREADY) IVPB 1250 mg/250 mL  Status:  Discontinued       ? 1,250 mg ?166.7 mL/hr over 90 Minutes Intravenous Every 8 hours 07/26/21 1544 07/26/21 1949  ? 07/26/21 0800  vancomycin (VANCOCIN) IVPB 1000 mg/200 mL premix  Status:  Discontinued       ? 1,000 mg ?200 mL/hr over 60 Minutes Intravenous Every 12 hours 07/25/21 1830 07/26/21 1544  ? 07/26/21 0500  cefTRIAXone (ROCEPHIN) 2 g in sodium chloride 0.9 % 100 mL IVPB  Status:  Discontinued       ? 2 g ?200 mL/hr over 30 Minutes Intravenous Every 12 hours 07/25/21 1809 07/26/21 1949  ? 07/25/21 2000  ampicillin (OMNIPEN) 2 g in sodium chloride 0.9 % 100 mL IVPB  Status:  Discontinued       ? 2 g ?300 mL/hr over 20 Minutes Intravenous Every 4 hours 07/25/21 1813 07/25/21 1823  ? 07/25/21 2000  acyclovir (ZOVIRAX) 775 mg in dextrose 5 % 150 mL IVPB  Status:  Discontinued       ?  10 mg/kg ? 77.6 kg ?165.5 mL/hr over 60 Minutes Intravenous Every 8 hours 07/25/21 1830 07/27/21 1507  ? 07/25/21 1615  cefTRIAXone (ROCEPHIN) 2 g in sodium chloride 0.9 % 100 mL IVPB       ? 2 g ?200 mL/hr over 30 Minutes Intravenous  Once 07/25/21 1602 07/25/21 1718  ? 07/25/21 1615  vancomycin (VANCOCIN) IVPB 1000 mg/200 mL premix  Status:  Discontinued       ? 1,000 mg ?200 mL/hr over 60 Minutes Intravenous  Once 07/25/21 1602 07/25/21 1607  ? 07/25/21 1615  vancomycin (VANCOREADY) IVPB 1750 mg/350 mL       ? 1,750 mg ?175 mL/hr over 120 Minutes Intravenous  Once 07/25/21 1607 07/25/21 1938  ? ?  ? ? ? ? ? ? ? ? ? ?Family Communication/Anticipated D/C date and plan/Code Status  ? ?DVT prophylaxis: SCDs Start: 07/25/21 1751 ? ? ?  Code Status: Full Code ? ?Family Communication: None ?Disposition Plan: Plan to  discharge home tomorrow ? ? ?Status is: Inpatient ?Remains inpatient appropriate because: Thyroid storm ? ? ? ? ? ? ?Subjective:  ? ?Interval events noted.  She complains of lower back pain from lumbar puncture, headache, fatigue from lack of sleep.  She says she is more depressed and she is not suicidal.  She had taken extra gabapentin pills to help her sleep about a week ago ? ?Objective:  ? ? ?Vitals:  ? 07/27/21 0500 07/27/21 0510 07/27/21 0956 07/27/21 1250  ?BP:  127/69  (!) 136/92  ?Pulse:   85 (!) 58  ?Resp:  14  16  ?Temp:  98.6 ?F (37 ?C)  98.4 ?F (36.9 ?C)  ?TempSrc:  Oral  Oral  ?SpO2:  100%  98%  ?Weight: 80.9 kg     ?Height:      ? ?No data found. ? ? ?Intake/Output Summary (Last 24 hours) at 07/27/2021 1507 ?Last data filed at 07/27/2021 0700 ?Gross per 24 hour  ?Intake 4955.47 ml  ?Output --  ?Net 4955.47 ml  ? ?Filed Weights  ? 07/25/21 2000 07/26/21 0500 07/27/21 0500  ?Weight: 90.9 kg 90.9 kg 80.9 kg  ? ? ?Exam: ? ?GEN: NAD ?SKIN: No rash ?EYES: EOMI ?ENT: MMM ?CV: RRR ?PULM: CTA B ?ABD: soft, ND, NT, +BS ?CNS: AAO x 3, non focal ?EXT: No edema or tenderness ? ? ? ?  ? ? ?Data Reviewed:  ? ?I have personally reviewed following labs and imaging studies: ? ?Labs: ?Labs show the following:  ? ?Basic Metabolic Panel: ?Recent Labs  ?Lab 07/25/21 ?1511 07/26/21 ?0451 07/27/21 ?0426  ?NA 134* 138 138  ?K 3.9 4.2 4.0  ?CL 102 110 109  ?CO2 18* 23 25  ?GLUCOSE 107* 139* 127*  ?BUN 13 12 11   ?CREATININE 0.71 0.66 0.61  ?CALCIUM 9.6 9.3 9.0  ?MG  --   --  1.8  ?PHOS  --   --  2.7  ? ?GFR ?Estimated Creatinine Clearance: 108.3 mL/min (by C-G formula based on SCr of 0.61 mg/dL). ?Liver Function Tests: ?Recent Labs  ?Lab 07/25/21 ?1511  ?AST 33  ?ALT 26  ?ALKPHOS 79  ?BILITOT 0.5  ?PROT 8.6*  ?ALBUMIN 4.1  ? ?No results for input(s): LIPASE, AMYLASE in the last 168 hours. ?No results for input(s): AMMONIA in the last 168 hours. ?Coagulation profile ?No results for input(s): INR, PROTIME in the last 168  hours. ? ?CBC: ?Recent Labs  ?Lab 07/25/21 ?1511 07/26/21 ?0451 07/27/21 ?0426  ?WBC 10.0 9.0 8.0  ?  NEUTROABS 6.6  --   --   ?HGB 13.4 11.7* 10.4*  ?HCT 39.4 34.4* 31.2*  ?MCV 81.1 81.1 83.0  ?PLT 311 268 205  ? ?Cardiac Enzymes: ?No results for input(s): CKTOTAL, CKMB, CKMBINDEX, TROPONINI in the last 168 hours. ?BNP (last 3 results) ?No results for input(s): PROBNP in the last 8760 hours. ?CBG: ?Recent Labs  ?Lab 07/25/21 ?2002  ?GLUCAP 108*  ? ?D-Dimer: ?No results for input(s): DDIMER in the last 72 hours. ?Hgb A1c: ?No results for input(s): HGBA1C in the last 72 hours. ?Lipid Profile: ?No results for input(s): CHOL, HDL, LDLCALC, TRIG, CHOLHDL, LDLDIRECT in the last 72 hours. ?Thyroid function studies: ?Recent Labs  ?  07/25/21 ?1511  ?TSH 0.028*  ?T3FREE 14.2*  ? ?Anemia work up: ?No results for input(s): VITAMINB12, FOLATE, FERRITIN, TIBC, IRON, RETICCTPCT in the last 72 hours. ?Sepsis Labs: ?Recent Labs  ?Lab 07/25/21 ?1511 07/25/21 ?1806 07/25/21 ?2059 07/26/21 ?0451 07/27/21 ?0426  ?PROCALCITON <0.10  --   --  <0.10 <0.10  ?WBC 10.0  --   --  9.0 8.0  ?LATICACIDVEN 7.4* 4.8* 1.4  --   --   ? ? ?Microbiology ?Recent Results (from the past 240 hour(s))  ?Urine Culture     Status: Abnormal  ? Collection Time: 07/25/21  3:53 PM  ? Specimen: Urine, Random  ?Result Value Ref Range Status  ? Specimen Description   Final  ?  URINE, RANDOM ?Performed at [facility], [address] [place], [place] [address] ?  ? Special Requests   Final  ?  NONE ?Performed at [facility], [address] [place], [place] [address] ?  ? Culture MULTIPLE SPECIES PRESENT, SUGGEST RECOLLECTION (A)  Final  ? Report Status 07/27/2021 FINAL  Final  ?Culture, blood (routine x 2)     Status: None (Preliminary result)  ? Collection Time: 07/25/21  4:41 PM  ? Specimen: BLOOD  ?Result Value Ref Range Status  ? Specimen Description BLOOD RIGHT FOREARM  Final  ? Special Requests   Final  ?  BOTTLES DRAWN AEROBIC AND  ANAEROBIC Blood Culture results may not be optimal due to an inadequate volume of blood received in culture bottles  ? Culture   Final  ?  NO GROWTH 2 DAYS ?Performed at [facility], [address] [place]

## 2021-07-27 NOTE — Progress Notes (Signed)
PHARMACY - PHYSICIAN COMMUNICATION ?CRITICAL VALUE ALERT - BLOOD CULTURE IDENTIFICATION (BCID) ? ?Susan Velazquez is an 25 y.o. female who presented to [facility] on 07/25/2021 with a chief complaint of multiple episodes of syncope and multiple seizure-like episodes ? ?Assessment:  BCID: Gram positive rods in 1/4 aerobic bottle. No recommendation for changes to antibiotic plan at this time.  ? ?Name of physician (or Provider) Contacted: Dr. Mal Misty ? ?Current antibiotics: Completed 3 days acyclovir, ceftriaxone, and vancomycin. Now discontinued given no evidence of sepsis or meningitis.  ? ?Changes to prescribed antibiotics recommended:  ?No change in therapy ?Recommendations accepted by provider ? ?No results found for this or any previous visit. ? ?Thank you for allowing pharmacy to be a part of this pleasant patient?s care. ? ?Darrick Penna, PharmD, MS PGPM ?Clinical Pharmacist ?07/27/2021 ?6:58 PM ? ? ?

## 2021-07-28 ENCOUNTER — Encounter: Payer: Self-pay | Admitting: Radiology

## 2021-07-28 ENCOUNTER — Inpatient Hospital Stay: Payer: 59

## 2021-07-28 DIAGNOSIS — R569 Unspecified convulsions: Secondary | ICD-10-CM

## 2021-07-28 DIAGNOSIS — F330 Major depressive disorder, recurrent, mild: Secondary | ICD-10-CM

## 2021-07-28 LAB — BASIC METABOLIC PANEL
Anion gap: 5 (ref 5–15)
BUN: 10 mg/dL (ref 6–20)
CO2: 26 mmol/L (ref 22–32)
Calcium: 8.8 mg/dL — ABNORMAL LOW (ref 8.9–10.3)
Chloride: 109 mmol/L (ref 98–111)
Creatinine, Ser: 0.7 mg/dL (ref 0.44–1.00)
GFR, Estimated: >60 mL/min (ref >60)
Glucose, Bld: 141 mg/dL — ABNORMAL HIGH (ref 70–99)
Potassium: 3.9 mmol/L (ref 3.5–5.1)
Sodium: 140 mmol/L (ref 135–145)

## 2021-07-28 LAB — CBC
HCT: 32 % — ABNORMAL LOW (ref 36.0–46.0)
Hemoglobin: 10.7 g/dL — ABNORMAL LOW (ref 12.0–15.0)
MCH: 28 pg (ref 26.0–34.0)
MCHC: 33.4 g/dL (ref 30.0–36.0)
MCV: 83.8 fL (ref 80.0–100.0)
Platelets: 229 10*3/uL (ref 150–400)
RBC: 3.82 MIL/uL — ABNORMAL LOW (ref 3.87–5.11)
RDW: 12.9 % (ref 11.5–15.5)
WBC: 7.9 10*3/uL (ref 4.0–10.5)
nRBC: 0 % (ref 0.0–0.2)

## 2021-07-28 LAB — HEMOGLOBIN A1C
Hgb A1c MFr Bld: 5.7 % — ABNORMAL HIGH (ref 4.8–5.6)
Mean Plasma Glucose: 116.89 mg/dL

## 2021-07-28 MED ORDER — DIPHENHYDRAMINE HCL 50 MG/ML IJ SOLN
25.0000 mg | Freq: Once | INTRAMUSCULAR | Status: AC
  Administered 2021-07-28: 25 mg via INTRAVENOUS
  Filled 2021-07-28: qty 1

## 2021-07-28 MED ORDER — LACTATED RINGERS IV SOLN: INTRAVENOUS | Status: DC

## 2021-07-28 MED ORDER — LACOSAMIDE 50 MG PO TABS
50.0000 mg | ORAL_TABLET | Freq: Two times a day (BID) | ORAL | Status: DC
  Administered 2021-07-29: 50 mg via ORAL
  Filled 2021-07-28: qty 1

## 2021-07-28 MED ORDER — LEVETIRACETAM 500 MG PO TABS: 500.0000 mg | ORAL_TABLET | Freq: Two times a day (BID) | ORAL | Status: DC

## 2021-07-28 MED ORDER — LACOSAMIDE 200 MG/20ML IV SOLN
200.0000 mg | Freq: Once | INTRAVENOUS | Status: AC
  Administered 2021-07-28: 200 mg via INTRAVENOUS
  Filled 2021-07-28: qty 20

## 2021-07-28 MED ORDER — GADOBUTROL 1 MMOL/ML IV SOLN
8.0000 mL | Freq: Once | INTRAVENOUS | Status: AC | PRN
  Administered 2021-07-28: 8 mL via INTRAVENOUS

## 2021-07-28 MED ORDER — PROCHLORPERAZINE EDISYLATE 10 MG/2ML IJ SOLN
10.0000 mg | Freq: Once | INTRAMUSCULAR | Status: AC
  Administered 2021-07-28: 10 mg via INTRAVENOUS
  Filled 2021-07-28: qty 2

## 2021-07-28 MED ORDER — DEXTROSE 5 % IV SOLN
650.0000 mg | Freq: Three times a day (TID) | INTRAVENOUS | Status: DC
  Administered 2021-07-28 – 2021-07-29 (×3): 650 mg via INTRAVENOUS
  Filled 2021-07-28 (×4): qty 13

## 2021-07-28 MED ORDER — METOCLOPRAMIDE HCL 5 MG/ML IJ SOLN
10.0000 mg | Freq: Once | INTRAMUSCULAR | Status: AC
  Administered 2021-07-28: 10 mg via INTRAVENOUS
  Filled 2021-07-28: qty 2

## 2021-07-28 MED ORDER — LORAZEPAM 2 MG/ML IJ SOLN
1.0000 mg | Freq: Once | INTRAMUSCULAR | Status: AC | PRN
  Administered 2021-07-28: 1 mg via INTRAVENOUS
  Filled 2021-07-28: qty 1

## 2021-07-28 NOTE — Progress Notes (Addendum)
? ? ? ?Progress Note  ? ? Susan Velazquez  [id number] DOB: Dec 22, 1995  DOA: 07/25/2021 ?PCP: Tereasa Coop, PA-C  ? ? ? ? ?Brief Narrative:  ? ? ?Medical records reviewed and are as summarized below: ? ?Susan Velazquez is a 26 y.o. female with past medical history significant for Hashimoto's thyroiditis, anxiety, depression, PTSD, history of alcohol use disorder, seizure disorder who presented to [facility] ED on 07/25/2021 due to multiple episodes of syncope and multiple seizure-like episodes.  Her Synthroid was recently decreased from 88 mcg to 75 mcg daily because her TSH was low.  She was supposed to go back for follow-up in May 2023. ?  ? ?She was admitted to the hospital for thyroid storm/hyperthyroidism. ? ? ? ? ?Assessment/Plan:  ? ?Principal Problem: ?  Thyroid storm ?Active Problems: ?  Major depressive disorder, recurrent episode, mild ([place]) ? ? ? ?Body mass index is 31.59 kg/m?.  (Obesity) ? ? ?Hyperthyroidism, thyroid storm in a patient with history of Hashimoto's thyroiditis: Discontinue propranolol because of bradycardia.  Discontinue ibuprofen and steroids.  Continue methimazole. ? ?CSF leukocytosis: HSV PCR is pending.  Case discussed with Dr. Quinn Axe, neurologist, who recommended restarting IV acyclovir until HSV report is finalized. ? ?Acute metabolic encephalopathy: Improved ? ?Seizure disorder: Patient said she had not been on any antiepileptics prior to admission.  She had stopped taking Depakote and Trileptal.  Continue Keppra for now.  Change IV to oral Keppra.  Plan for MRI brain with and without contrast today. ?She saw Dr. Hassell Done in the office on 05/16/2021.   ? ?Major depressive disorder, recurrent, mild: Appreciate input from psychiatrist.  No imminent risk to self or others at present per psychiatrist.   ? ?Insomnia: Trazodone for sleep at night ? ? ? ?Diet Order   ? ?       ?  Diet regular Room service appropriate? Yes; Fluid consistency: Thin  Diet effective now       ?  ? ?   ?  ? ?  ? ? ? ? ? ? ? ?Consultants: ?Intensivist ?Neurologist ? ?Procedures: ?None ? ? ? ?Medications:  ? ? levETIRAcetam  500 mg Oral BID  ? methimazole  20 mg Oral TID  ? sodium chloride flush  3 mL Intravenous Q12H  ? traZODone  50 mg Oral QHS  ? ?Continuous Infusions: ? sodium chloride    ? ? ? ?Anti-infectives (From admission, onward)  ? ? Start     Dose/Rate Route Frequency Ordered Stop  ? 07/26/21 1800  vancomycin (VANCOREADY) IVPB 1250 mg/250 mL  Status:  Discontinued       ? 1,250 mg ?166.7 mL/hr over 90 Minutes Intravenous Every 8 hours 07/26/21 1544 07/26/21 1949  ? 07/26/21 0800  vancomycin (VANCOCIN) IVPB 1000 mg/200 mL premix  Status:  Discontinued       ? 1,000 mg ?200 mL/hr over 60 Minutes Intravenous Every 12 hours 07/25/21 1830 07/26/21 1544  ? 07/26/21 0500  cefTRIAXone (ROCEPHIN) 2 g in sodium chloride 0.9 % 100 mL IVPB  Status:  Discontinued       ? 2 g ?200 mL/hr over 30 Minutes Intravenous Every 12 hours 07/25/21 1809 07/26/21 1949  ? 07/25/21 2000  ampicillin (OMNIPEN) 2 g in sodium chloride 0.9 % 100 mL IVPB  Status:  Discontinued       ? 2 g ?300 mL/hr over 20 Minutes Intravenous Every 4 hours 07/25/21 1813 07/25/21 1823  ? 07/25/21 2000  acyclovir (ZOVIRAX)  775 mg in dextrose 5 % 150 mL IVPB  Status:  Discontinued       ? 10 mg/kg ? 77.6 kg ?165.5 mL/hr over 60 Minutes Intravenous Every 8 hours 07/25/21 1830 07/27/21 1507  ? 07/25/21 1615  cefTRIAXone (ROCEPHIN) 2 g in sodium chloride 0.9 % 100 mL IVPB       ? 2 g ?200 mL/hr over 30 Minutes Intravenous  Once 07/25/21 1602 07/25/21 1718  ? 07/25/21 1615  vancomycin (VANCOCIN) IVPB 1000 mg/200 mL premix  Status:  Discontinued       ? 1,000 mg ?200 mL/hr over 60 Minutes Intravenous  Once 07/25/21 1602 07/25/21 1607  ? 07/25/21 1615  vancomycin (VANCOREADY) IVPB 1750 mg/350 mL       ? 1,750 mg ?175 mL/hr over 120 Minutes Intravenous  Once 07/25/21 1607 07/25/21 1938  ? ?  ? ? ? ? ? ? ? ? ? ?Family Communication/Anticipated D/C date and  plan/Code Status  ? ?DVT prophylaxis: SCDs Start: 07/25/21 1751 ? ? ?  Code Status: Full Code ? ?Family Communication: Plan discussed with her father at the bedside ?Disposition Plan: Plan to discharge home tomorrow ? ? ?Status is: Inpatient ?Remains inpatient appropriate because: Thyroid storm ? ? ? ? ? ? ?Subjective:  ? ?Interval events noted.  She wants to go home.  She complains of fatigue and lack of sleep.  She still has a mild headache.  No other complaints. ? ? ?Objective:  ? ? ?Vitals:  ? 07/27/21 2203 07/28/21 0047 07/28/21 0543 07/28/21 0805  ?BP: 130/74 125/67 126/80 120/75  ?Pulse: 70 66 (!) 46 (!) 50  ?Resp:  18 15 16   ?Temp:  97.8 ?F (36.6 ?C)  97.8 ?F (36.6 ?C)  ?TempSrc:    Oral  ?SpO2:  98% 99% 100%  ?Weight:      ?Height:      ? ?No data found. ? ? ?Intake/Output Summary (Last 24 hours) at 07/28/2021 1336 ?Last data filed at 07/28/2021 0500 ?Gross per 24 hour  ?Intake 1196.95 ml  ?Output --  ?Net 1196.95 ml  ? ?Filed Weights  ? 07/25/21 2000 07/26/21 0500 07/27/21 0500  ?Weight: 90.9 kg 90.9 kg 80.9 kg  ? ? ?Exam: ? ?GEN: NAD ?SKIN: No rash ?EYES: EOMI ?ENT: MMM, no neck stiffness ?CV: RRR ?PULM: CTA B ?ABD: soft, ND, NT, +BS ?CNS: AAO x 3, non focal ?EXT: No edema or tenderness ? ? ? ?  ? ? ?Data Reviewed:  ? ?I have personally reviewed following labs and imaging studies: ? ?Labs: ?Labs show the following:  ? ?Basic Metabolic Panel: ?Recent Labs  ?Lab 07/25/21 ?1511 07/26/21 ?0451 07/27/21 ?[id number] 07/28/21 ?0403  ?NA 134* 138 138 140  ?K 3.9 4.2 4.0 3.9  ?CL 102 110 109 109  ?CO2 18* 23 25 26   ?GLUCOSE 107* 139* 127* 141*  ?BUN 13 12 11 10   ?CREATININE 0.71 0.66 0.61 0.70  ?CALCIUM 9.6 9.3 9.0 8.8*  ?MG  --   --  1.8  --   ?PHOS  --   --  2.7  --   ? ?GFR ?Estimated Creatinine Clearance: 108.3 mL/min (by C-G formula based on SCr of 0.7 mg/dL). ?Liver Function Tests: ?Recent Labs  ?Lab 07/25/21 ?1511  ?AST 33  ?ALT 26  ?ALKPHOS 79  ?BILITOT 0.5  ?PROT 8.6*  ?ALBUMIN 4.1  ? ?No results for input(s):  LIPASE, AMYLASE in the last 168 hours. ?No results for input(s): AMMONIA in the last 168 hours. ?Coagulation  profile ?No results for input(s): INR, PROTIME in the last 168 hours. ? ?CBC: ?Recent Labs  ?Lab 07/25/21 ?1511 07/26/21 ?0451 07/27/21 ?[id number] 07/28/21 ?0403  ?WBC 10.0 9.0 8.0 7.9  ?NEUTROABS 6.6  --   --   --   ?HGB 13.4 11.7* 10.4* 10.7*  ?HCT 39.4 34.4* 31.2* 32.0*  ?MCV 81.1 81.1 83.0 83.8  ?PLT 311 268 205 229  ? ?Cardiac Enzymes: ?No results for input(s): CKTOTAL, CKMB, CKMBINDEX, TROPONINI in the last 168 hours. ?BNP (last 3 results) ?No results for input(s): PROBNP in the last 8760 hours. ?CBG: ?Recent Labs  ?Lab 07/25/21 ?2002  ?GLUCAP 108*  ? ?D-Dimer: ?No results for input(s): DDIMER in the last 72 hours. ?Hgb A1c: ?No results for input(s): HGBA1C in the last 72 hours. ?Lipid Profile: ?No results for input(s): CHOL, HDL, LDLCALC, TRIG, CHOLHDL, LDLDIRECT in the last 72 hours. ?Thyroid function studies: ?Recent Labs  ?  07/25/21 ?1511  ?TSH 0.028*  ?T3FREE 14.2*  ? ?Anemia work up: ?No results for input(s): VITAMINB12, FOLATE, FERRITIN, TIBC, IRON, RETICCTPCT in the last 72 hours. ?Sepsis Labs: ?Recent Labs  ?Lab 07/25/21 ?1511 07/25/21 ?1806 07/25/21 ?2059 07/26/21 ?0451 07/27/21 ?[id number] 07/28/21 ?0403  ?PROCALCITON <0.10  --   --  <0.10 <0.10  --   ?WBC 10.0  --   --  9.0 8.0 7.9  ?LATICACIDVEN 7.4* 4.8* 1.4  --   --   --   ? ? ?Microbiology ?Recent Results (from the past 240 hour(s))  ?Urine Culture     Status: Abnormal  ? Collection Time: 07/25/21  3:53 PM  ? Specimen: Urine, Random  ?Result Value Ref Range Status  ? Specimen Description   Final  ?  URINE, RANDOM ?Performed at [facility], [address] [place], [place] [address] ?  ? Special Requests   Final  ?  NONE ?Performed at [facility], [address] [place], [place] [address] ?  ? Culture MULTIPLE SPECIES PRESENT, SUGGEST RECOLLECTION (A)  Final  ? Report Status 07/27/2021 FINAL  Final  ?Culture, blood (routine x  2)     Status: None (Preliminary result)  ? Collection Time: 07/25/21  4:41 PM  ? Specimen: BLOOD  ?Result Value Ref Range Status  ? Specimen Description BLOOD RIGHT FOREARM  Final  ? Special Requests   Fina

## 2021-07-28 NOTE — Consult Note (Signed)
[facility] Face-to-Face Psychiatry Consult   Reason for Consult:  depression Referring Physician:  Dr Darla Lesches Patient Identification: Susan Velazquez MRN:  [id number] Principal Diagnosis: Thyroid storm Diagnosis:  Principal Problem:   Thyroid storm Active Problems:   Major depressive disorder, recurrent episode, mild (HCC)   Total Time spent with patient: 45 minutes  Subjective:   Susan Velazquez is a 25 y.o. female patient admitted with thyroid issues, depression history.  HPI:  26 yo female presented to the ED with seizures and thyroid issues, past history of overdose.  Client denies suicidal ideations or over taking any medications prior to admission.  Mild depression with a psychiatrist and therapist in place.  Past history of alcohol use d/o, last drink in February, information provided about AA, agreeable to explore.  No homicidal ideations or hallucinations.  Permission obtained to speak with her father who was in bedside, he has no safety concerns.  Psychiatrically cleared for discharge.  Past Psychiatric History: depression, anxiety  Risk to Self:  none Risk to Others:  none Prior Inpatient Therapy:  multiple times Prior Outpatient Therapy:  in place  Past Medical History:  Past Medical History:  Diagnosis Date   Anxiety    Depression    Hashimoto's disease     Past Surgical History:  Procedure Laterality Date   IR FL GUIDED LOC OF NEEDLE/CATH TIP FOR SPINAL INJECTION LT  07/26/2021   KNEE SURGERY     Family History: History reviewed. No pertinent family history. Family Psychiatric  History: none Social History:  Social History   Substance and Sexual Activity  Alcohol Use Yes     Social History   Substance and Sexual Activity  Drug Use Never    Social History   Socioeconomic History   Marital status: Single    Spouse name: Not on file   Number of children: Not on file   Years of education: Not on file   Highest education level: Not on file   Occupational History   Not on file  Tobacco Use   Smoking status: Some Days    Types: Cigarettes   Smokeless tobacco: Never  Vaping Use   Vaping Use: Never used  Substance and Sexual Activity   Alcohol use: Yes   Drug use: Never   Sexual activity: Not on file  Other Topics Concern   Not on file  Social History Narrative   Not on file   Social Determinants of Health   Financial Resource Strain: Not on file  Food Insecurity: Not on file  Transportation Needs: Not on file  Physical Activity: Not on file  Stress: Not on file  Social Connections: Not on file   Additional Social History:    Allergies:   Allergies  Allergen Reactions   Duloxetine Hcl Other (See Comments)    Suicidal ideation Other reaction(s): Other Suicidal ideation   Iodine Other (See Comments)    "affects my thyroid"    Labs:  Results for orders placed or performed during the hospital encounter of 07/25/21 (from the past 48 hour(s))  Glucose, CSF     Status: Abnormal   Collection Time: 07/26/21  3:45 PM  Result Value Ref Range   Glucose, CSF 81 (H) 40 - 70 mg/dL    Comment: Performed at [facility], [address] [place], [place] [address]  Protein, CSF     Status: None   Collection Time: 07/26/21  3:45 PM  Result Value Ref Range   Total  Protein,  CSF 22 15 - 45 mg/dL    Comment: Performed at [facility], [address] [place], [place] [address]  CSF cell count with differential     Status: Abnormal   Collection Time: 07/26/21  3:45 PM  Result Value Ref Range   Tube # 3    Color, CSF COLORLESS COLORLESS   Appearance, CSF CLEAR (A) CLEAR   Supernatant CLEAR    RBC Count, CSF 2 0 - 3 /cu mm   WBC, CSF 25 (HH) 0 - 5 /cu mm    Comment: CRITICAL RESULT CALLED TO, READ BACK BY AND VERIFIED WITH: NATHAN SHIRLEY 07/26/21 @ 1710 BY SB    Segmented Neutrophils-CSF 0 %   Lymphs, CSF 34 %   Monocyte-Macrophage-Spinal Fluid 66 %   Eosinophils, CSF 0 %    Comment: Performed  at [facility], [address] [place], [place] [address]  CSF culture w Gram Stain     Status: None (Preliminary result)   Collection Time: 07/26/21  3:45 PM   Specimen: PATH Cytology CSF; Cerebrospinal Fluid  Result Value Ref Range   Specimen Description      CSF Performed at [facility], [address], [place], [place] [address]    Special Requests      NONE Performed at [facility], [address] [place], [place] [address]    Gram Stain      NO ORGANISMS SEEN WBC SEEN RED BLOOD CELLS RESULT CALLED TO, READ BACK BY AND VERIFIED WITH: NATHAN SHIRLEY AT 1729 ON 07/26/21 BY SS GRAM STAIN REVIEWED-AGREE WITH RESULT    Culture      NO GROWTH 2 DAYS Performed at [facility], [address], [place], [place] [address]    Report Status PENDING   Procalcitonin     Status: None   Collection Time: 07/27/21  4:26 AM  Result Value Ref Range   Procalcitonin <0.10 ng/mL    Comment:        Interpretation: PCT (Procalcitonin) <= 0.5 ng/mL: Systemic infection (sepsis) is not likely. Local bacterial infection is possible. (NOTE)       Sepsis PCT Algorithm           Lower Respiratory Tract                                      Infection PCT Algorithm    ----------------------------     ----------------------------         PCT < 0.25 ng/mL                PCT < 0.10 ng/mL          Strongly encourage             Strongly discourage   discontinuation of antibiotics    initiation of antibiotics    ----------------------------     -----------------------------       PCT 0.25 - 0.50 ng/mL            PCT 0.10 - 0.25 ng/mL               OR       >80% decrease in PCT            Discourage initiation of  antibiotics      Encourage discontinuation           of antibiotics    ----------------------------     -----------------------------         PCT >= 0.50 ng/mL              PCT 0.26 - 0.50 ng/mL                AND        <80% decrease in PCT             Encourage initiation of                                             antibiotics       Encourage continuation           of antibiotics    ----------------------------     -----------------------------        PCT >= 0.50 ng/mL                  PCT > 0.50 ng/mL               AND         increase in PCT                  Strongly encourage                                      initiation of antibiotics    Strongly encourage escalation           of antibiotics                                     -----------------------------                                           PCT <= 0.25 ng/mL                                                 OR                                        > 80% decrease in PCT                                      Discontinue / Do not initiate                                             antibiotics  Performed at [facility], [address], [place], [place] [address]   CBC     Status: Abnormal   Collection Time: 07/27/21  4:26 AM  Result Value Ref Range   WBC 8.0 4.0 - 10.5 K/uL   RBC 3.76 (L) 3.87 - 5.11 MIL/uL   Hemoglobin 10.4 (L) 12.0 - 15.0 g/dL   HCT 16.1 (L) 09.6 - 04.5 %   MCV 83.0 80.0 - 100.0 fL   MCH 27.7 26.0 - 34.0 pg   MCHC 33.3 30.0 - 36.0 g/dL   RDW 40.9 81.1 - 91.4 %   Platelets 205 150 - 400 K/uL   nRBC 0.0 0.0 - 0.2 %    Comment: Performed at [facility], [address], [place], [place] [address]  Basic metabolic panel     Status: Abnormal   Collection Time: 07/27/21  4:26 AM  Result Value Ref Range   Sodium 138 135 - 145 mmol/L   Potassium 4.0 3.5 - 5.1 mmol/L   Chloride 109 98 - 111 mmol/L   CO2 25 22 - 32 mmol/L   Glucose, Bld 127 (H) 70 - 99 mg/dL    Comment: Glucose reference range applies only to samples taken after fasting for at least 8 hours.   BUN 11 6 - 20 mg/dL   Creatinine, Ser 6.21 0.44 - 1.00 mg/dL   Calcium 9.0 8.9 - 30.8 mg/dL   GFR, Estimated >65 >78 mL/min     Comment: (NOTE) Calculated using the CKD-EPI Creatinine Equation (2021)    Anion gap 4 (L) 5 - 15    Comment: Performed at [facility], [address] [place], [place] [address]  Magnesium     Status: None   Collection Time: 07/27/21  4:26 AM  Result Value Ref Range   Magnesium 1.8 1.7 - 2.4 mg/dL    Comment: Performed at [facility], [address] [place], [place] [address]  Phosphorus     Status: None   Collection Time: 07/27/21  4:26 AM  Result Value Ref Range   Phosphorus 2.7 2.5 - 4.6 mg/dL    Comment: Performed at [facility], [address] [place], [place] [address]  CBC     Status: Abnormal   Collection Time: 07/28/21  4:03 AM  Result Value Ref Range   WBC 7.9 4.0 - 10.5 K/uL   RBC 3.82 (L) 3.87 - 5.11 MIL/uL   Hemoglobin 10.7 (L) 12.0 - 15.0 g/dL   HCT 01.0 (L) 27.2 - 53.6 %   MCV 83.8 80.0 - 100.0 fL   MCH 28.0 26.0 - 34.0 pg   MCHC 33.4 30.0 - 36.0 g/dL   RDW 64.4 03.4 - 74.2 %   Platelets 229 150 - 400 K/uL   nRBC 0.0 0.0 - 0.2 %    Comment: Performed at [facility], [address] [place], [place] [address]  Basic metabolic panel     Status: Abnormal   Collection Time: 07/28/21  4:03 AM  Result Value Ref Range   Sodium 140 135 - 145 mmol/L   Potassium 3.9 3.5 - 5.1 mmol/L   Chloride 109 98 - 111 mmol/L   CO2 26 22 - 32 mmol/L   Glucose, Bld 141 (H) 70 - 99 mg/dL    Comment: Glucose reference range applies only to samples taken after fasting for at least 8 hours.   BUN 10 6 - 20 mg/dL   Creatinine, Ser 8.75 0.44 - 1.00 mg/dL   Calcium 8.8 (L) 8.9 - 10.3 mg/dL   GFR, Estimated >64 >33 mL/min    Comment: (NOTE) Calculated using the CKD-EPI Creatinine Equation (2021)  Anion gap 5 5 - 15    Comment: Performed at [facility], [address] [place], [place] [id number]    Current Facility-Administered Medications  Medication Dose Route Frequency Provider Last Rate Last Admin   0.9 %  sodium  chloride infusion  250 mL Intravenous PRN Lurene ShadowAyiku, Bernard, MD       acetaminophen (TYLENOL) tablet 650 mg  650 mg Oral Q4H PRN Lurene ShadowAyiku, Bernard, MD   650 mg at 07/26/21 0744   calcium carbonate (TUMS - dosed in mg elemental calcium) chewable tablet 200 mg of elemental calcium  1 tablet Oral TID PRN Lurene ShadowAyiku, Bernard, MD   200 mg of elemental calcium at 07/27/21 0107   dexamethasone (DECADRON) injection 2 mg  2 mg Intravenous Q6H Lurene ShadowAyiku, Bernard, MD   2 mg at 07/28/21 1002   docusate sodium (COLACE) capsule 100 mg  100 mg Oral BID PRN Lurene ShadowAyiku, Bernard, MD       famotidine (PEPCID) IVPB 20 mg premix  20 mg Intravenous Q12H Lurene ShadowAyiku, Bernard, MD 100 mL/hr at 07/28/21 1041 20 mg at 07/28/21 1041   HYDROmorphone (DILAUDID) injection 1 mg  1 mg Intravenous Q4H PRN Lurene ShadowAyiku, Bernard, MD   1 mg at 07/28/21 0246   ibuprofen (ADVIL) tablet 800 mg  800 mg Oral TID Lurene ShadowAyiku, Bernard, MD   800 mg at 07/28/21 1002   levETIRAcetam (KEPPRA) IVPB 500 mg/100 mL premix  500 mg Intravenous Q12H Lurene ShadowAyiku, Bernard, MD 400 mL/hr at 07/28/21 1007 500 mg at 07/28/21 1007   methimazole (TAPAZOLE) tablet 20 mg  20 mg Oral TID Lurene ShadowAyiku, Bernard, MD   20 mg at 07/28/21 1003   ondansetron (ZOFRAN) injection 4 mg  4 mg Intravenous Q6H PRN Lurene ShadowAyiku, Bernard, MD   4 mg at 07/27/21 2200   oxyCODONE-acetaminophen (PERCOCET/ROXICET) 5-325 MG per tablet 1 tablet  1 tablet Oral Q6H PRN Lurene ShadowAyiku, Bernard, MD   1 tablet at 07/28/21 0029   polyethylene glycol (MIRALAX / GLYCOLAX) packet 17 g  17 g Oral Daily PRN Lurene ShadowAyiku, Bernard, MD       propranolol (INDERAL) tablet 60 mg  60 mg Oral QID Lurene ShadowAyiku, Bernard, MD   60 mg at 07/28/21 1003   sodium chloride flush (NS) 0.9 % injection 3 mL  3 mL Intravenous Q12H Lurene ShadowAyiku, Bernard, MD   3 mL at 07/28/21 1008   sodium chloride flush (NS) 0.9 % injection 3 mL  3 mL Intravenous PRN Lurene ShadowAyiku, Bernard, MD       traZODone (DESYREL) tablet 50 mg  50 mg Oral QHS Lurene ShadowAyiku, Bernard, MD   50 mg at 07/27/21 2200    Musculoskeletal: Strength &  Muscle Tone: decreased Gait & Station: normal Patient leans: N/A  Psychiatric Specialty Exam: Physical Exam Vitals and nursing note reviewed.  Constitutional:      Appearance: Normal appearance.  HENT:     Head: Normocephalic.     Nose: Nose normal.  Pulmonary:     Effort: Pulmonary effort is normal.  Musculoskeletal:        General: Normal range of motion.     Cervical back: Normal range of motion.  Neurological:     General: No focal deficit present.     Mental Status: She is alert and oriented to person, place, and time.  Psychiatric:        Attention and Perception: Attention and perception normal.        Mood and Affect: Mood is depressed.        Speech: Speech  normal.        Behavior: Behavior normal. Behavior is cooperative.        Thought Content: Thought content normal.        Cognition and Memory: Cognition and memory normal.        Judgment: Judgment normal.    Review of Systems  Constitutional:  Positive for malaise/fatigue.  Psychiatric/Behavioral:  Positive for depression.   All other systems reviewed and are negative.  Blood pressure 120/75, pulse (!) 50, temperature 97.8 F (36.6 C), temperature source Oral, resp. rate 16, height  (1.6 m), weight 80.9 kg, last menstrual period 07/13/2021, SpO2 100 %.Body mass index is 31.59 kg/m.  General Appearance: Casual  Eye Contact:  Good  Speech:  Normal Rate  Volume:  Normal  Mood:  Depressed  Affect:  Congruent  Thought Process:  Coherent and Descriptions of Associations: Intact  Orientation:  Full (Time, Place, and Person)  Thought Content:  WDL and Logical  Suicidal Thoughts:  No  Homicidal Thoughts:  No  Memory:  Immediate;   Good Recent;   Good Remote;   Good  Judgement:  Fair  Insight:  Good  Psychomotor Activity:  Decreased  Concentration:  Concentration: Good and Attention Span: Good  Recall:  Good  Fund of Knowledge:  Good  Language:  Good  Akathisia:  No  Handed:  Right  AIMS (if  indicated):     Assets:  Housing Intimacy Leisure Time Resilience Social Support  ADL's:  Intact  Cognition:  WNL  Sleep:        Physical Exam: Physical Exam Vitals and nursing note reviewed.  Constitutional:      Appearance: Normal appearance.  HENT:     Head: Normocephalic.     Nose: Nose normal.  Pulmonary:     Effort: Pulmonary effort is normal.  Musculoskeletal:        General: Normal range of motion.     Cervical back: Normal range of motion.  Neurological:     General: No focal deficit present.     Mental Status: She is alert and oriented to person, place, and time.  Psychiatric:        Attention and Perception: Attention and perception normal.        Mood and Affect: Mood is depressed.        Speech: Speech normal.        Behavior: Behavior normal. Behavior is cooperative.        Thought Content: Thought content normal.        Cognition and Memory: Cognition and memory normal.        Judgment: Judgment normal.   Review of Systems  Constitutional:  Positive for malaise/fatigue.  Psychiatric/Behavioral:  Positive for depression.   All other systems reviewed and are negative. Blood pressure 120/75, pulse (!) 50, temperature 97.8 F (36.6 C), temperature source Oral, resp. rate 16, height  (1.6 m), weight 80.9 kg, last menstrual period 07/13/2021, SpO2 100 %. Body mass index is 31.59 kg/m.  Treatment Plan Summary: Major depressive disorder, recurrent, mild: Continue treatment plan from her psychiatrist and therapist  Disposition: No evidence of imminent risk to self or others at present.    Nanine Means, NP 07/28/2021 11:27 AM

## 2021-07-28 NOTE — Consult Note (Signed)
Pharmacy Antibiotic Note ? ?ALISSE TUITE is a 26 y.o. female admitted on 07/25/2021 with multiple syncopal episodes and seizures. Of note, patient found to have hyperthyroidism. Concern for meningitis and patient underwent LP on 4/28. Pharmacy has been consulted for acyclovir dosing pending CSF HSV PCR finalization. ? ?CSF cytology: 25 WBC, protein normal, glucose normal ? ?Plan: ? ?Acyclovir 650 mg (10 mg/kg AdjBW, BMI 31) IV q8h ?--LR at 125 cc/hr while on IV acyclovir for renal protection ? ?Height: 5\' 3"  (160 cm) ?Weight: 80.9 kg (178 lb 5.6 oz) ?IBW/kg (Calculated) : 52.4 ? ?Temp (24hrs), Avg:97.9 ?F (36.6 ?C), Min:97.6 ?F (36.4 ?C), Max:98.2 ?F (36.8 ?C) ? ?Recent Labs  ?Lab 07/25/21 ?1511 07/25/21 ?1806 07/25/21 ?2059 07/26/21 ?0451 07/27/21 ?07/29/21 07/28/21 ?0403  ?WBC 10.0  --   --  9.0 8.0 7.9  ?CREATININE 0.71  --   --  0.66 0.61 0.70  ?LATICACIDVEN 7.4* 4.8* 1.4  --   --   --   ?  ?Estimated Creatinine Clearance: 108.3 mL/min (by C-G formula based on SCr of 0.7 mg/dL).   ? ?Allergies  ?Allergen Reactions  ? Duloxetine Hcl Other (See Comments)  ?  Suicidal ideation ?Other reaction(s): Other ?Suicidal ideation  ? Iodine Other (See Comments)  ?  "affects my thyroid"  ? ? ?Antimicrobials this admission: ?Acyclovir 4/27 >> 4/29, 4/30 >> ?Ceftriaxone 4/27 >> 4/28 ?Vancomycin 4/27 >> 4/28 ? ?Dose adjustments this admission: ?N/A ? ?Microbiology results: ?4/27 BCx: 1/4 bottles GPR, pending ?4/27 UCx: Multiple species  ?4/28 CSF: NG, pending ?4/28 HSV PCR (CSF): pending ?4/28 VDRL (CSF): pending ? ?Thank you for allowing pharmacy to be a part of this patient?s care. ? ?5/28 ?07/28/2021 1:45 PM ? ?

## 2021-07-29 DIAGNOSIS — R569 Unspecified convulsions: Secondary | ICD-10-CM

## 2021-07-29 LAB — CREATININE, SERUM
Creatinine, Ser: 0.65 mg/dL (ref 0.44–1.00)
GFR, Estimated: >60 mL/min (ref >60)

## 2021-07-29 LAB — PATHOLOGIST SMEAR REVIEW

## 2021-07-29 LAB — HSV 1/2 PCR, CSF
HSV-1 DNA: NEGATIVE
HSV-2 DNA: NEGATIVE

## 2021-07-29 LAB — VDRL, CSF: VDRL Quant, CSF: NONREACTIVE

## 2021-07-29 MED ORDER — LACOSAMIDE 50 MG PO TABS: 50.0000 mg | ORAL_TABLET | Freq: Two times a day (BID) | ORAL | 0 refills | Status: AC

## 2021-07-29 NOTE — Consult Note (Addendum)
NEUROLOGY CONSULTATION NOTE   Date of service: Jul 29, 2021 Patient Name: Susan Velazquez MRN:  [id number] DOB:  Apr 10, 1995 Reason for consult: breakthrough seizure Requesting physician: Dr. Lurene Shadow _ _ _   _ __   _ __ _ _  __ __   _ __   __ _  History of Present Illness   This is a 26 yo woman with pmhx Hashimoto's thyroiditis, anxiety, depression, PTSD, seizure disorder who presented with multiple events concerning for seizure in the setting of new severe headache and fever per patient up to 105 degrees at home. She does not remember the events c/f seizure and does not recall a prodrome. She had multiple events day of admission including witnessed by EMS that were described as GTCs, the one witnessed by EMS was aborted with 10mg  versed. She had an LP on admission with WBC 25 and RBC 2. She was treated empirically with acyclovir until yesterday evening when it was discontinued 2/2 clinical stability. HSV PCR on CSF is pending.  TSH: 0.028, was 27.277 about 9 months ago Free T4: 4.32  She is admitted for breakthrough seizures and concern for hyperthyroidism / thyroid storm.  She is f/b Dr. Peyton Najjar and Alois Cliche at [facility] neurology resident clinic. She was prescribed carbamazepine for seizures but has not been taking it 2/2 side effect of incoordination. She was previously on depakote which was discontinued 2/2 concern for teratogenicity and lamotrigine which was discontinued 2/2 rash. She was loaded with keppra this admission and started on maintenance. She has a history of severe depression. She had an outpatient EEG which was normal, no spells captured. She has not undergone MRI brain.  Head CT in ED NAICP (personal review)   ROS   Per HPI: all other systems reviewed and are negative  Past History   I have reviewed the following:  Past Medical History:  Diagnosis Date  . Anxiety   . Depression   . Hashimoto's disease    Past Surgical History:  Procedure Laterality Date   . IR FL GUIDED LOC OF NEEDLE/CATH TIP FOR SPINAL INJECTION LT  07/26/2021  . KNEE SURGERY     History reviewed. No pertinent family history. Social History   Socioeconomic History  . Marital status: Single    Spouse name: Not on file  . Number of children: Not on file  . Years of education: Not on file  . Highest education level: Not on file  Occupational History  . Not on file  Tobacco Use  . Smoking status: Some Days    Types: Cigarettes  . Smokeless tobacco: Never  Vaping Use  . Vaping Use: Never used  Substance and Sexual Activity  . Alcohol use: Yes  . Drug use: Never  . Sexual activity: Not on file  Other Topics Concern  . Not on file  Social History Narrative  . Not on file   Social Determinants of Health   Financial Resource Strain: Not on file  Food Insecurity: Not on file  Transportation Needs: Not on file  Physical Activity: Not on file  Stress: Not on file  Social Connections: Not on file   Allergies  Allergen Reactions  . Duloxetine Hcl Other (See Comments)    Suicidal ideation Other reaction(s): Other Suicidal ideation  . Iodine Other (See Comments)    "affects my thyroid"    Medications   Medications Prior to Admission  Medication Sig Dispense Refill Last Dose  . escitalopram (LEXAPRO) 20 MG tablet Take  20 mg by mouth daily.   Past Week  . gabapentin (NEURONTIN) 300 MG capsule Take 300 mg by mouth 3 (three) times daily.   Past Week  . levothyroxine (SYNTHROID) 75 MCG tablet Take 75 mcg by mouth daily.   Past Week  . Disulfiram 500 MG TABS Take by mouth.     . divalproex (DEPAKOTE ER) 500 MG 24 hr tablet Take 500 mg by mouth 3 (three) times daily. (Patient not taking: Reported on 07/25/2021)   Not Taking  . ondansetron (ZOFRAN ODT) 4 MG disintegrating tablet Take 1 tablet (4 mg total) by mouth every 8 (eight) hours as needed for nausea or vomiting. (Patient not taking: Reported on 02/25/2021) 12 tablet 0   . OXcarbazepine (TRILEPTAL) 150 MG  tablet Take 150 mg by mouth 2 (two) times daily. (Patient not taking: Reported on 07/25/2021)   Not Taking  . SPRAVATO, 84 MG DOSE, 28 MG/DEVICE SOPK Place 3 sprays into the nose as directed.         Current Facility-Administered Medications:  .  0.9 %  sodium chloride infusion, 250 mL, Intravenous, PRN, Lurene Shadow, MD .  acetaminophen (TYLENOL) tablet 650 mg, 650 mg, Oral, Q4H PRN, Lurene Shadow, MD, 650 mg at 07/28/21 1539 .  acyclovir (ZOVIRAX) 650 mg in dextrose 5 % 100 mL IVPB, 650 mg, Intravenous, Q8H, Tressie Ellis, RPH, Last Rate: 113 mL/hr at 07/28/21 2214, 650 mg at 07/28/21 2214 .  calcium carbonate (TUMS - dosed in mg elemental calcium) chewable tablet 200 mg of elemental calcium, 1 tablet, Oral, TID PRN, Lurene Shadow, MD, 200 mg of elemental calcium at 07/27/21 0107 .  docusate sodium (COLACE) capsule 100 mg, 100 mg, Oral, BID PRN, Lurene Shadow, MD .  [address] lacosamide (VIMPAT) 200 mg in sodium chloride 0.9 % 25 mL IVPB, 200 mg, Intravenous, Once, Stopped at 07/28/21 1907 **FOLLOWED BY** lacosamide (VIMPAT) tablet 50 mg, 50 mg, Oral, BID, Jefferson Fuel, MD .  lactated ringers infusion, , Intravenous, Continuous, Tressie Ellis [facility], Last Rate: 125 mL/hr at 07/28/21 1437, New Bag at 07/28/21 1437 .  methimazole (TAPAZOLE) tablet 20 mg, 20 mg, Oral, TID, Lurene Shadow, MD, 20 mg at 07/28/21 2214 .  ondansetron (ZOFRAN) injection 4 mg, 4 mg, Intravenous, Q6H PRN, Lurene Shadow, MD, 4 mg at 07/29/21 0407 .  polyethylene glycol (MIRALAX / GLYCOLAX) packet 17 g, 17 g, Oral, Daily PRN, Lurene Shadow, MD .  sodium chloride flush (NS) 0.9 % injection 3 mL, 3 mL, Intravenous, Q12H, Lurene Shadow, MD, 3 mL at 07/28/21 1008 .  sodium chloride flush (NS) 0.9 % injection 3 mL, 3 mL, Intravenous, PRN, Lurene Shadow, MD .  traZODone (DESYREL) tablet 50 mg, 50 mg, Oral, QHS, Lurene Shadow, MD, 50 mg at 07/27/21 2200  Vitals   Vitals:   07/28/21 0805 07/28/21 2214  07/29/21 0406 07/29/21 0500  BP: 120/75 127/89 129/83   Pulse: (!) 50 (!) 51 (!) 53   Resp: 16 16 16    Temp: 97.8 F (36.6 C) 98 F (36.7 C) 97.9 F (36.6 C)   TempSrc: Oral Oral    SpO2: 100% 99% 99%   Weight:    81 kg  Height:         Body mass index is 31.63 kg/m.  Physical Exam   Physical Exam Gen: A&O x4, NAD HEENT: Atraumatic, normocephalic;mucous membranes moist; oropharynx clear, tongue without atrophy or fasciculations. Neck: Supple, trachea midline. Resp: CTAB, no w/r/r CV: RRR, no m/g/r; nml  S1 and S2. 2+ symmetric peripheral pulses. Abd: soft/NT/ND; nabs x 4 quad Extrem: Nml bulk; no cyanosis, clubbing, or edema.  Neuro: *MS: A&O x4. Follows multi-step commands.  *Speech: fluid, nondysarthric, able to name and repeat *CN:    I: Deferred   II,III: PERRLA, VFF by confrontation, optic discs unable to be visualized 2/2 pupillary constriction   III,IV,VI: EOMI w/o nystagmus, no ptosis   V: Sensation intact from V1 to V3 to LT   VII: Eyelid closure was full.  Smile symmetric.   VIII: Hearing intact to voice   IX,X: Voice normal, palate elevates symmetrically    XI: SCM/trap 5/5 bilat   XII: Tongue protrudes midline, no atrophy or fasciculations   *Motor:   Normal bulk.  No tremor, rigidity or bradykinesia. No pronator drift.    Strength: Dlt Bic Tri WrE WrF FgS Gr HF KnF KnE PlF DoF    Left 5 5 5 5 5 5 5 5 5 5 5 5     Right 5 5 5 5 5 5 5 5 5 5 5 5     *Sensory: Intact to light touch, pinprick, temperature vibration throughout. Symmetric. Propioception intact bilat.  No double-simultaneous extinction.  *Coordination:  Finger-to-nose, heel-to-shin, rapid alternating motions were intact. *Reflexes:  2+ and symmetric throughout without clonus; toes down-going bilat *Gait: deferred   Labs   CBC:  Recent Labs  Lab 07/25/21 1511 07/26/21 0451 07/27/21 0426 07/28/21 0403  WBC 10.0   < > 8.0 7.9  NEUTROABS 6.6  --   --   --   HGB 13.4   < > 10.4* 10.7*   HCT 39.4   < > 31.2* 32.0*  MCV 81.1   < > 83.0 83.8  PLT 311   < > 205 229   < > = values in this interval not displayed.    Basic Metabolic Panel:  Lab Results  Component Value Date   NA 140 07/28/2021   K 3.9 07/28/2021   CO2 26 07/28/2021   GLUCOSE 141 (H) 07/28/2021   BUN 10 07/28/2021   CREATININE 0.70 07/28/2021   CALCIUM 8.8 (L) 07/28/2021   GFRNONAA >60 07/28/2021   GFRAA >60 08/28/2019   Lipid Panel: No results found for: LDLCALC HgbA1c:  Lab Results  Component Value Date   HGBA1C 5.7 (H) 07/28/2021   Urine Drug Screen:     Component Value Date/Time   LABOPIA NONE DETECTED 07/25/2021 1553   COCAINSCRNUR NONE DETECTED 07/25/2021 1553   LABBENZ POSITIVE (A) 07/25/2021 1553   AMPHETMU NONE DETECTED 07/25/2021 1553   THCU NONE DETECTED 07/25/2021 1553   LABBARB NONE DETECTED 07/25/2021 1553    Alcohol Level     Component Value Date/Time   ETH <10 07/25/2021 1511     Impression   This is a 26 yo woman with pmhx Hashimoto's thyroiditis, anxiety, depression, PTSD, seizure disorder who presented with multiple events concerning for seizure in the setting of new severe headache and fever per patient up to 105 degrees at home. She had an LP on admission with WBC 25 and RBC 2. Given that these results are potentially compatible with herpes encephalitis I would recommend restarting acyclovir and continuing this until HSV PCR on CSF results negative.  MRI brain wwo contrast ordered to complete seizure workup. rEEG as outpatient was normal, no spells captured. I think she should be on an AED but keppra is not the best choice for her given her hx severe depression. She was previously on depakote  which was discontinued 2/2 concern for teratogenicity and lamotrigine which was discontinued 2/2 rash. She recently self-discontinued carbamazepine 2/2 side effect of incoordination. I will load her with vimpat and continue 50mg  bid. Since vimpat will be loaded, OK to d/c keppra  without a taper.  She has had a severe headache since admission and has received IV dilaudid for this x8 in the past 3 days. Dilaudid is not ideal for headache mgmt given it is habit forming and sets her up for rebound headaches. I have discontinued this order and will order migraine cocktail for this evening.  Recommendations   - MRI brain wwo contrast - Continue acyclovir until HSV PCR results negative - Vimpat 200mg  IV load f/b 50mg  po q 12 hrs - D/c keppra - D/c dilaudid - Benadryl/compazine/reglan migraine cocktail - Pt counseled that according to [place] law she should not drive x6 mos after last seizure. She should also not perform any activities that could be dangerous if she had a seizure while doing them including operating heavy machinery, climbing ladders, or swimming/bathing alone. She expressed understanding. - F/u in neurology resident clinic at [facility] as scheduled  ______________________________________________________________________   Thank you for the opportunity to take part in the care of this patient. If you have any further questions, please contact the neurology consultation attending.  Signed,  Bing Neighbors, MD [facility] [phone]  If 7pm- 7am, please page neurology on call as listed in AMION.

## 2021-07-29 NOTE — Consult Note (Addendum)
Pharmacy Antibiotic Note ? ?Susan Velazquez is a 26 y.o. female admitted on 07/25/2021 with multiple syncopal episodes and seizures. Of note, patient found to have hyperthyroidism. Concern for meningitis and patient underwent LP on 4/28. Pharmacy has been consulted for acyclovir dosing pending CSF HSV PCR finalization. ? ?CSF cytology: 25 WBC, protein normal, glucose normal ? ?Plan: ? ?--Continue Acyclovir 650 mg (10 mg/kg AdjBW, BMI 31) IV q8h ?--LR at 125 cc/hr while on IV acyclovir for renal protection ?--Ordered stat serum creatinine to assess renal function.  ? ?Height: 5\' 3"  (160 cm) ?Weight: 81 kg (178 lb 9.2 oz) ?IBW/kg (Calculated) : 52.4 ? ?Temp (24hrs), Avg:97.9 ?F (36.6 ?C), Min:97.8 ?F (36.6 ?C), Max:98 ?F (36.7 ?C) ? ?Recent Labs  ?Lab 07/25/21 ?1511 07/25/21 ?1806 07/25/21 ?2059 07/26/21 ?0451 07/27/21 ?07/29/21 07/28/21 ?0403  ?WBC 10.0  --   --  9.0 8.0 7.9  ?CREATININE 0.71  --   --  0.66 0.61 0.70  ?LATICACIDVEN 7.4* 4.8* 1.4  --   --   --   ? ?  ?Estimated Creatinine Clearance: 108.3 mL/min (by C-G formula based on SCr of 0.7 mg/dL).   ? ?Allergies  ?Allergen Reactions  ? Duloxetine Hcl Other (See Comments)  ?  Suicidal ideation ?Other reaction(s): Other ?Suicidal ideation  ? Iodine Other (See Comments)  ?  "affects my thyroid"  ? ? ?Antimicrobials this admission: ?Acyclovir 4/27 >> 4/29, 4/30 >> ?Ceftriaxone 4/27 >> 4/28 ?Vancomycin 4/27 >> 4/28 ? ?Dose adjustments this admission: ?N/A ? ?Microbiology results: ?4/27 BCx: 1/4 bottles GPR, pending ?4/27 UCx: Multiple species  ?4/28 CSF: NG, pending ?4/28 HSV PCR (CSF): pending ?4/28 VDRL (CSF): pending ? ?Thank you for allowing pharmacy to be a part of this pleasant patient?s care. ? ?5/28, PharmD, MS PGPM ?Clinical Pharmacist ?07/29/2021 ?7:41 AM ? ? ?

## 2021-07-29 NOTE — Progress Notes (Signed)
NT requests nurse assistance in room stating that patient is wanting to leave AMA.  Upon arrival to room, patient is dressed and gathering her belongings.  She states that she has an ortho appointment at 11 and is not going to miss that appointment.  She also states that she is not staying here until this afternoon to see the neurologist again.  MD notified that patient wants to leave AMA and IV removed from patient.  MD arrived to bedside.  Patient continues to voice intent to leave and signed AMA paper.  Patient advised to return to hospital if needed and encouraged to f/u with PCP and neurology outpatient.   ?

## 2021-07-29 NOTE — Discharge Summary (Addendum)
? ?Physician Discharge Summary  ?Susan Velazquez HWE:[id number] DOB: 07/14/1995 DOA: 07/25/2021 ? ?PCP: Ofilia Neas, PA-C ? ?Admit date: 07/25/2021 ?Discharge date: 07/29/2021 ? ?Discharge disposition: She left the hospital AGAINST MEDICAL ADVICE ? ? ?Recommendations for Outpatient Follow-Up:  ? ?Outpatient follow-up with PCP and neurology strongly recommended. ?She has been advised to avoid driving any motor vehicle, swimming or working at heights. ? ? ?Discharge Diagnosis:  ? ?Principal Problem: ?  Thyroid storm ?Active Problems: ?  Major depressive disorder, recurrent episode, mild (HCC) ?  Seizure (HCC) ? ? ? ?Discharge Condition: Stable. ? ?Diet recommendation:  ?Diet Order   ? ?       ?  Diet regular Room service appropriate? Yes; Fluid consistency: Thin  Diet effective now       ?  ? ?  ?  ? ?  ? ? ?  Code Status: Full Code ? ? ? ? ?Hospital Course:  ? ?Susan Velazquez is a 26 y.o. female with past medical history significant for Hashimoto's thyroiditis, anxiety, depression, PTSD, history of alcohol use disorder, seizure disorder who presented to [facility] ED on 07/25/2021 due to multiple episodes of syncope and multiple seizure-like episodes.  Her Synthroid was recently decreased from 88 mcg to 75 mcg daily because her TSH was low.  She was supposed to go back for follow-up in May 2023. ?  ?  ?She was admitted to the hospital for seizures and thyroid storm/hyperthyroidism.  She was treated with IV steroids, IV fluids, propranolol and methimazole.  She was also treated with empiric IV antimicrobial therapy for suspected sepsis.  Sepsis was ruled out so antibiotics were discontinued.  She underwent lumbar puncture which showed CSF leukocytosis.  Neurologist was consulted and it was recommended that IV acyclovir be continued until HSV PCR becomes available.  ? ?Unfortunately, patient insisted on going home before HSV PCR result.  Attempts to convince the patient to stay until she was cleared by the  neurologist for discharge  proved futile.  She understands that she is at risk of recurrent seizures, life-threatening infections and death.  However, she was adamant about going home.  Her mother was at the bedside during this encounter.  Her nurse was also at the bedside.  She understands that she can return to the hospital if she has any problems. ?Of note, even though she has signed out AGAINST MEDICAL ADVICE, she was given a prescription for Vimpat. ? ? ?Medical Consultants:  ? ?Neurologist ?Psychiatrist ? ? ?Discharge Exam:  ? ? ?Vitals:  ? 07/28/21 2214 07/29/21 0406 07/29/21 0500 07/29/21 0800  ?BP: 127/89 129/83  130/81  ?Pulse: (!) 51 (!) 53  (!) 59  ?Resp: 16 16  16   ?Temp: 98 ?F (36.7 ?C) 97.9 ?F (36.6 ?C)  98.4 ?F (36.9 ?C)  ?TempSrc: Oral   Oral  ?SpO2: 99% 99%  99%  ?Weight:   81 kg   ?Height:      ? ? ? ? ? ?The results of significant diagnostics from this hospitalization (including imaging, microbiology, ancillary and laboratory) are listed below for reference.   ? ? ?Procedures and Diagnostic Studies:  ? ?CT Head Wo Contrast ? ?Result Date: 07/25/2021 ?CLINICAL DATA:  26 year old female presents with history of seizures. EXAM: CT HEAD WITHOUT CONTRAST TECHNIQUE: Contiguous axial images were obtained from the base of the skull through the vertex without intravenous contrast. RADIATION DOSE REDUCTION: This exam was performed according to the departmental dose-optimization program which includes automated exposure control,  adjustment of the mA and/or kV according to patient size and/or use of iterative reconstruction technique. COMPARISON:  None FINDINGS: Brain: No evidence of acute infarction, hemorrhage, hydrocephalus, extra-axial collection or mass lesion/mass effect. Vascular: No hyperdense vessel or unexpected calcification. Skull: Normal. Negative for fracture or focal lesion. Sinuses/Orbits: Visualized paranasal sinuses and orbits are unremarkable to the extent evaluated. Other: None IMPRESSION:  No acute intracranial abnormality. Electronically Signed   By: Donzetta Kohut M.D.   On: 07/25/2021 15:40  ? ?IR FL GUIDED LOC OF NEEDL/CATH TIP FOR SPINAL INJECT LT ? ?Result Date: 07/27/2021 ?INDICATION: Seizure workup. EXAM: FLUOROSCOPIC GUIDED LUMBAR PUNCTURE COMPARISON:  CT AP, 08/26/2019. MEDICATIONS: Local anesthetic was administered. CONTRAST:  None. FLUOROSCOPY TIME:  Fluoroscopic dose; 23.8 mGy COMPLICATIONS: None immediate TECHNIQUE: Informed written consent was obtained from the the patient and/or patient's representative after a discussion of the risks, benefits and alternatives to treatment. Questions regarding the procedure were encouraged and answered. A timeout was performed prior to the initiation of the procedure. The patient was placed in RIGHT lateral decubitus on the fluoroscopy table, secondary to upper extremity injury. The L2 - L3 transverse foramina was marked fluoroscopically. The skin overlying the operative site was prepped and draped in the usual sterile fashion. Local anesthesia was provided with 1% lidocaine. Under intermittent fluoroscopic guidance, a 22 gauge spinal needle was advanced into the spinal canal. Appropriate positioning was confirmed with the efflux of clear CSF. Pressures were not measured. Approximately 20 mL ml of cerebrospinal fluid was collected into four separate containers. Samples were sent to the Laboratory as requested per the clinical team. The needle was removed and hemostasis was achieved with manual compression. A dressing was placed. The patient tolerated the above procedure well without immediate postprocedural complication. FINDINGS: Fluoroscopic imaging demonstrates appropriate positioning of the spinal needle within the spinal canal. Successful fluoroscopic lumbar puncture yielding approximately 20 mL of clear cerebrospinal fluid. IMPRESSION: Successful fluoroscopic guided lumbar puncture with 20 mL of clear CSF obtained. Samples were sent to the  Laboratory as requested per the clinical team. Roanna Banning, MD [facility] Radiology Electronically Signed   By: Roanna Banning M.D.   On: 07/27/2021 10:07  ? ?DG Chest Port 1 View ? ?Result Date: 07/25/2021 ?CLINICAL DATA:  Multiple syncopal episodes today. 4-5 witnessed seizures on weighted hospital today. EXAM: PORTABLE CHEST 1 VIEW COMPARISON:  None. FINDINGS: Cardiac silhouette and mediastinal contours are within normal limits. The lungs are clear. No pleural effusion or pneumothorax. No acute skeletal abnormality. IMPRESSION: No active disease. Electronically Signed   By: Neita Garnet M.D.   On: 07/25/2021 18:40  ? ? ? ?Labs:  ? ?Basic Metabolic Panel: ?Recent Labs  ?Lab 07/25/21 ?1511 07/26/21 ?0451 07/27/21 ?0426 07/28/21 ?0403 07/29/21 ?3875  ?NA 134* 138 138 140  --   ?K 3.9 4.2 4.0 3.9  --   ?CL 102 110 109 109  --   ?CO2 18* 23 25 26   --   ?GLUCOSE 107* 139* 127* 141*  --   ?BUN 13 12 11 10   --   ?CREATININE 0.71 0.66 0.61 0.70 0.65  ?CALCIUM 9.6 9.3 9.0 8.8*  --   ?MG  --   --  1.8  --   --   ?PHOS  --   --  2.7  --   --   ? ?GFR ?Estimated Creatinine Clearance: 108.3 mL/min (by C-G formula based on SCr of 0.65 mg/dL). ?Liver Function Tests: ?Recent Labs  ?Lab  07/25/21 ?1511  ?AST 33  ?ALT 26  ?ALKPHOS 79  ?BILITOT 0.5  ?PROT 8.6*  ?ALBUMIN 4.1  ? ?No results for input(s): LIPASE, AMYLASE in the last 168 hours. ?No results for input(s): AMMONIA in the last 168 hours. ?Coagulation profile ?No results for input(s): INR, PROTIME in the last 168 hours. ? ?CBC: ?Recent Labs  ?Lab 07/25/21 ?1511 07/26/21 ?0451 07/27/21 ?[id number] 07/28/21 ?0403  ?WBC 10.0 9.0 8.0 7.9  ?NEUTROABS 6.6  --   --   --   ?HGB 13.4 11.7* 10.4* 10.7*  ?HCT 39.4 34.4* 31.2* 32.0*  ?MCV 81.1 81.1 83.0 83.8  ?PLT 311 268 205 229  ? ?Cardiac Enzymes: ?No results for input(s): CKTOTAL, CKMB, CKMBINDEX, TROPONINI in the last 168 hours. ?BNP: ?Invalid input(s): POCBNP ?CBG: ?Recent Labs  ?Lab  07/25/21 ?2002  ?GLUCAP 108*  ? ?D-Dimer ?No results for input(s): DDIMER in the last 72 hours. ?Hgb A1c ?Recent Labs  ?  07/28/21 ?0403  ?HGBA1C 5.7*  ? ?Lipid Profile ?No results for input(s): CHOL, HDL, LDLCALC, T

## 2021-07-30 LAB — CULTURE, BLOOD (ROUTINE X 2)
Culture: NO GROWTH
Special Requests: ADEQUATE

## 2021-07-30 LAB — CSF CULTURE W GRAM STAIN
Culture: NO GROWTH
Gram Stain: NONE SEEN

## 2021-08-06 DIAGNOSIS — R569 Unspecified convulsions: Secondary | ICD-10-CM | POA: Diagnosis not present

## 2021-08-27 DIAGNOSIS — R259 Unspecified abnormal involuntary movements: Secondary | ICD-10-CM | POA: Diagnosis not present

## 2021-09-02 DIAGNOSIS — R259 Unspecified abnormal involuntary movements: Secondary | ICD-10-CM | POA: Diagnosis not present

## 2021-09-03 DIAGNOSIS — R259 Unspecified abnormal involuntary movements: Secondary | ICD-10-CM | POA: Diagnosis not present

## 2021-10-09 DIAGNOSIS — R69 Illness, unspecified: Secondary | ICD-10-CM | POA: Diagnosis not present

## 2021-10-09 DIAGNOSIS — M25532 Pain in left wrist: Secondary | ICD-10-CM | POA: Diagnosis not present

## 2021-10-09 DIAGNOSIS — E039 Hypothyroidism, unspecified: Secondary | ICD-10-CM | POA: Diagnosis not present

## 2021-10-09 DIAGNOSIS — F1020 Alcohol dependence, uncomplicated: Secondary | ICD-10-CM | POA: Diagnosis not present

## 2021-10-23 DIAGNOSIS — R7989 Other specified abnormal findings of blood chemistry: Secondary | ICD-10-CM | POA: Diagnosis not present

## 2021-10-23 DIAGNOSIS — F1090 Alcohol use, unspecified, uncomplicated: Secondary | ICD-10-CM | POA: Diagnosis not present

## 2021-10-23 DIAGNOSIS — M25532 Pain in left wrist: Secondary | ICD-10-CM | POA: Diagnosis not present

## 2021-10-23 DIAGNOSIS — R69 Illness, unspecified: Secondary | ICD-10-CM | POA: Diagnosis not present

## 2021-10-30 DIAGNOSIS — S63501A Unspecified sprain of right wrist, initial encounter: Secondary | ICD-10-CM | POA: Diagnosis not present

## 2021-10-30 DIAGNOSIS — M25531 Pain in right wrist: Secondary | ICD-10-CM | POA: Diagnosis not present

## 2021-11-05 DIAGNOSIS — S61211A Laceration without foreign body of left index finger without damage to nail, initial encounter: Secondary | ICD-10-CM | POA: Diagnosis not present

## 2021-12-19 DIAGNOSIS — R042 Hemoptysis: Secondary | ICD-10-CM | POA: Diagnosis not present

## 2021-12-19 DIAGNOSIS — R7989 Other specified abnormal findings of blood chemistry: Secondary | ICD-10-CM | POA: Diagnosis not present

## 2022-02-15 DIAGNOSIS — Z79890 Hormone replacement therapy (postmenopausal): Secondary | ICD-10-CM | POA: Diagnosis not present

## 2022-02-15 DIAGNOSIS — R002 Palpitations: Secondary | ICD-10-CM | POA: Diagnosis not present

## 2022-02-15 DIAGNOSIS — E039 Hypothyroidism, unspecified: Secondary | ICD-10-CM | POA: Diagnosis not present

## 2022-02-15 DIAGNOSIS — R509 Fever, unspecified: Secondary | ICD-10-CM | POA: Diagnosis not present

## 2022-02-15 DIAGNOSIS — Z91048 Other nonmedicinal substance allergy status: Secondary | ICD-10-CM | POA: Diagnosis not present

## 2022-02-15 DIAGNOSIS — Z20822 Contact with and (suspected) exposure to covid-19: Secondary | ICD-10-CM | POA: Diagnosis not present

## 2022-02-15 DIAGNOSIS — Z888 Allergy status to other drugs, medicaments and biological substances status: Secondary | ICD-10-CM | POA: Diagnosis not present

## 2022-02-15 DIAGNOSIS — G40909 Epilepsy, unspecified, not intractable, without status epilepticus: Secondary | ICD-10-CM | POA: Diagnosis not present

## 2022-02-15 DIAGNOSIS — J45909 Unspecified asthma, uncomplicated: Secondary | ICD-10-CM | POA: Diagnosis not present

## 2022-02-15 DIAGNOSIS — R519 Headache, unspecified: Secondary | ICD-10-CM | POA: Diagnosis not present

## 2022-02-15 DIAGNOSIS — Z91041 Radiographic dye allergy status: Secondary | ICD-10-CM | POA: Diagnosis not present

## 2022-02-16 DIAGNOSIS — R002 Palpitations: Secondary | ICD-10-CM | POA: Diagnosis not present

## 2022-03-21 DIAGNOSIS — M778 Other enthesopathies, not elsewhere classified: Secondary | ICD-10-CM | POA: Diagnosis not present

## 2022-03-21 DIAGNOSIS — M25521 Pain in right elbow: Secondary | ICD-10-CM | POA: Diagnosis not present

## 2022-04-01 DIAGNOSIS — U071 COVID-19: Secondary | ICD-10-CM | POA: Diagnosis not present

## 2022-04-16 DIAGNOSIS — S76012A Strain of muscle, fascia and tendon of left hip, initial encounter: Secondary | ICD-10-CM | POA: Diagnosis not present

## 2022-04-24 DIAGNOSIS — S76012A Strain of muscle, fascia and tendon of left hip, initial encounter: Secondary | ICD-10-CM | POA: Diagnosis not present

## 2022-04-28 DIAGNOSIS — Z8639 Personal history of other endocrine, nutritional and metabolic disease: Secondary | ICD-10-CM | POA: Diagnosis not present

## 2022-04-28 DIAGNOSIS — Z1331 Encounter for screening for depression: Secondary | ICD-10-CM | POA: Diagnosis not present

## 2022-04-28 DIAGNOSIS — F419 Anxiety disorder, unspecified: Secondary | ICD-10-CM | POA: Diagnosis not present

## 2022-04-28 DIAGNOSIS — F32A Depression, unspecified: Secondary | ICD-10-CM | POA: Diagnosis not present

## 2022-04-28 DIAGNOSIS — Z1330 Encounter for screening examination for mental health and behavioral disorders, unspecified: Secondary | ICD-10-CM | POA: Diagnosis not present

## 2022-05-26 DIAGNOSIS — X58XXXA Exposure to other specified factors, initial encounter: Secondary | ICD-10-CM | POA: Diagnosis not present

## 2022-05-26 DIAGNOSIS — M25562 Pain in left knee: Secondary | ICD-10-CM | POA: Diagnosis not present

## 2022-05-26 DIAGNOSIS — S8392XA Sprain of unspecified site of left knee, initial encounter: Secondary | ICD-10-CM | POA: Diagnosis not present

## 2022-05-26 DIAGNOSIS — X500XXA Overexertion from strenuous movement or load, initial encounter: Secondary | ICD-10-CM | POA: Diagnosis not present

## 2022-05-26 DIAGNOSIS — E0580 Other thyrotoxicosis without thyrotoxic crisis or storm: Secondary | ICD-10-CM | POA: Diagnosis not present

## 2022-05-26 DIAGNOSIS — E063 Autoimmune thyroiditis: Secondary | ICD-10-CM | POA: Diagnosis not present

## 2022-05-29 DIAGNOSIS — E0580 Other thyrotoxicosis without thyrotoxic crisis or storm: Secondary | ICD-10-CM | POA: Diagnosis not present

## 2022-05-29 DIAGNOSIS — E063 Autoimmune thyroiditis: Secondary | ICD-10-CM | POA: Diagnosis not present

## 2022-05-29 DIAGNOSIS — E049 Nontoxic goiter, unspecified: Secondary | ICD-10-CM | POA: Diagnosis not present

## 2022-06-11 DIAGNOSIS — E0580 Other thyrotoxicosis without thyrotoxic crisis or storm: Secondary | ICD-10-CM | POA: Diagnosis not present

## 2022-06-11 DIAGNOSIS — E063 Autoimmune thyroiditis: Secondary | ICD-10-CM | POA: Diagnosis not present

## 2022-06-12 DIAGNOSIS — M546 Pain in thoracic spine: Secondary | ICD-10-CM | POA: Diagnosis not present

## 2022-06-12 DIAGNOSIS — R1033 Periumbilical pain: Secondary | ICD-10-CM | POA: Diagnosis not present

## 2022-06-12 DIAGNOSIS — M438X4 Other specified deforming dorsopathies, thoracic region: Secondary | ICD-10-CM | POA: Diagnosis not present

## 2023-12-19 IMAGING — CR DG ELBOW COMPLETE 3+V*L*
4 series · 4 of 4 positions shown · non-contrast
Comparison: None.

CLINICAL DATA: Co-worker and ladder fell on patient. Now with
numbness and tingling around the elbow

EXAM:
LEFT ELBOW - COMPLETE 3+ VIEW

[elbow ap]
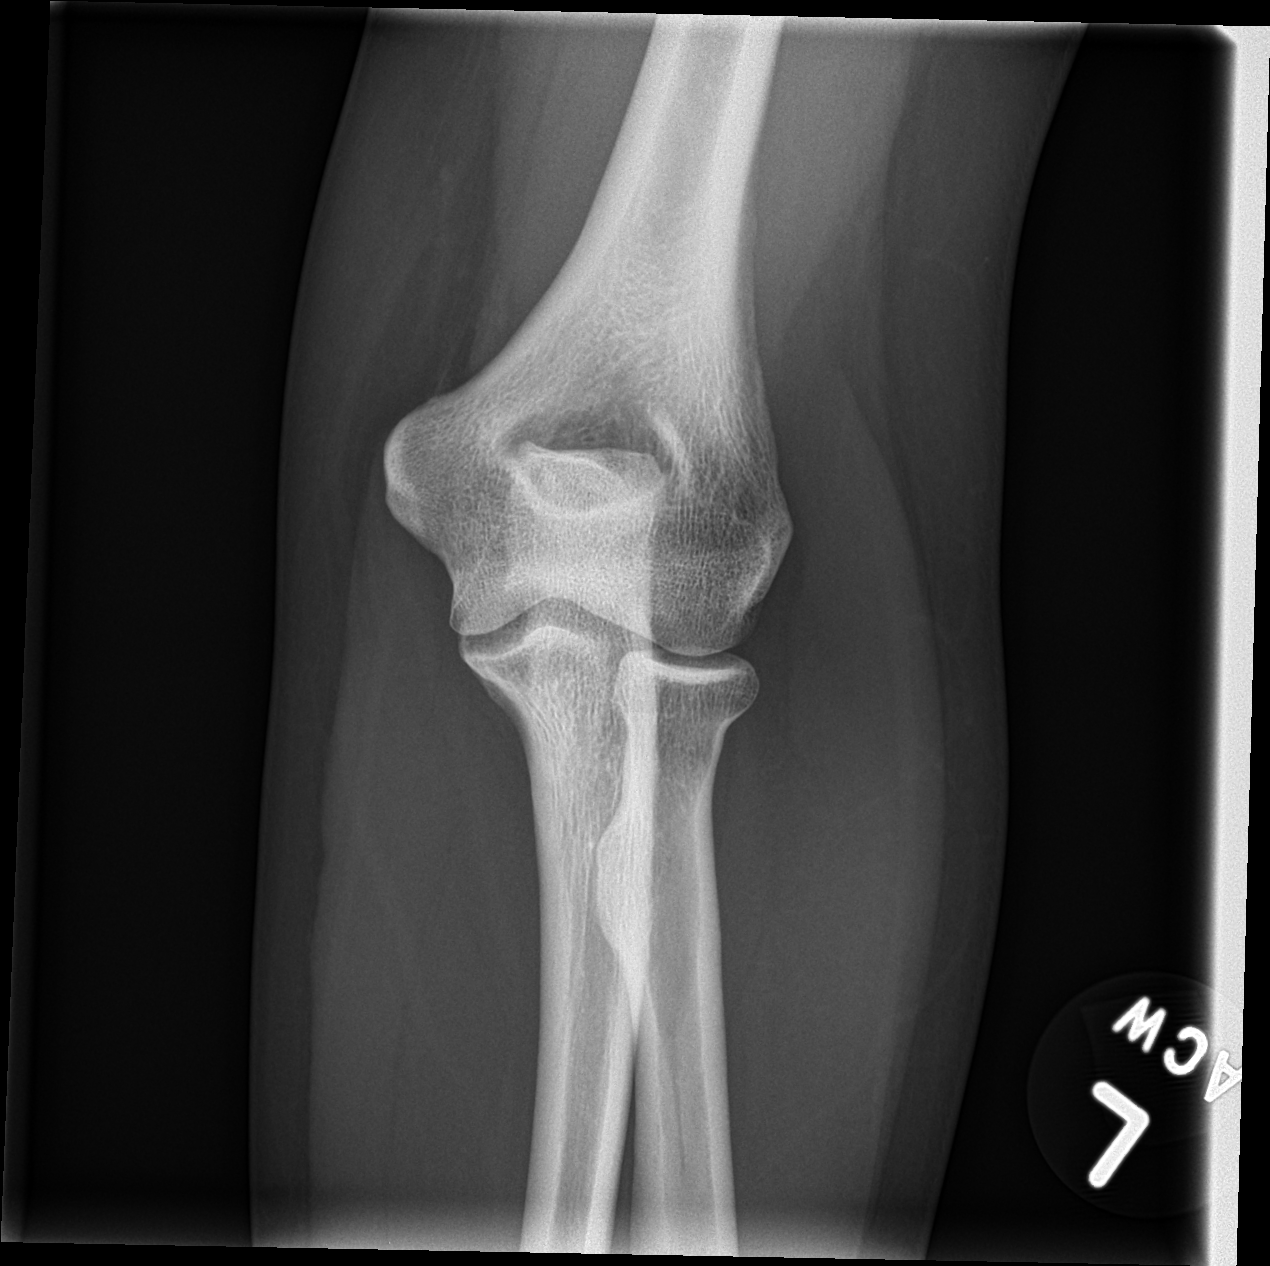

[elbow obl (1 of 2)]
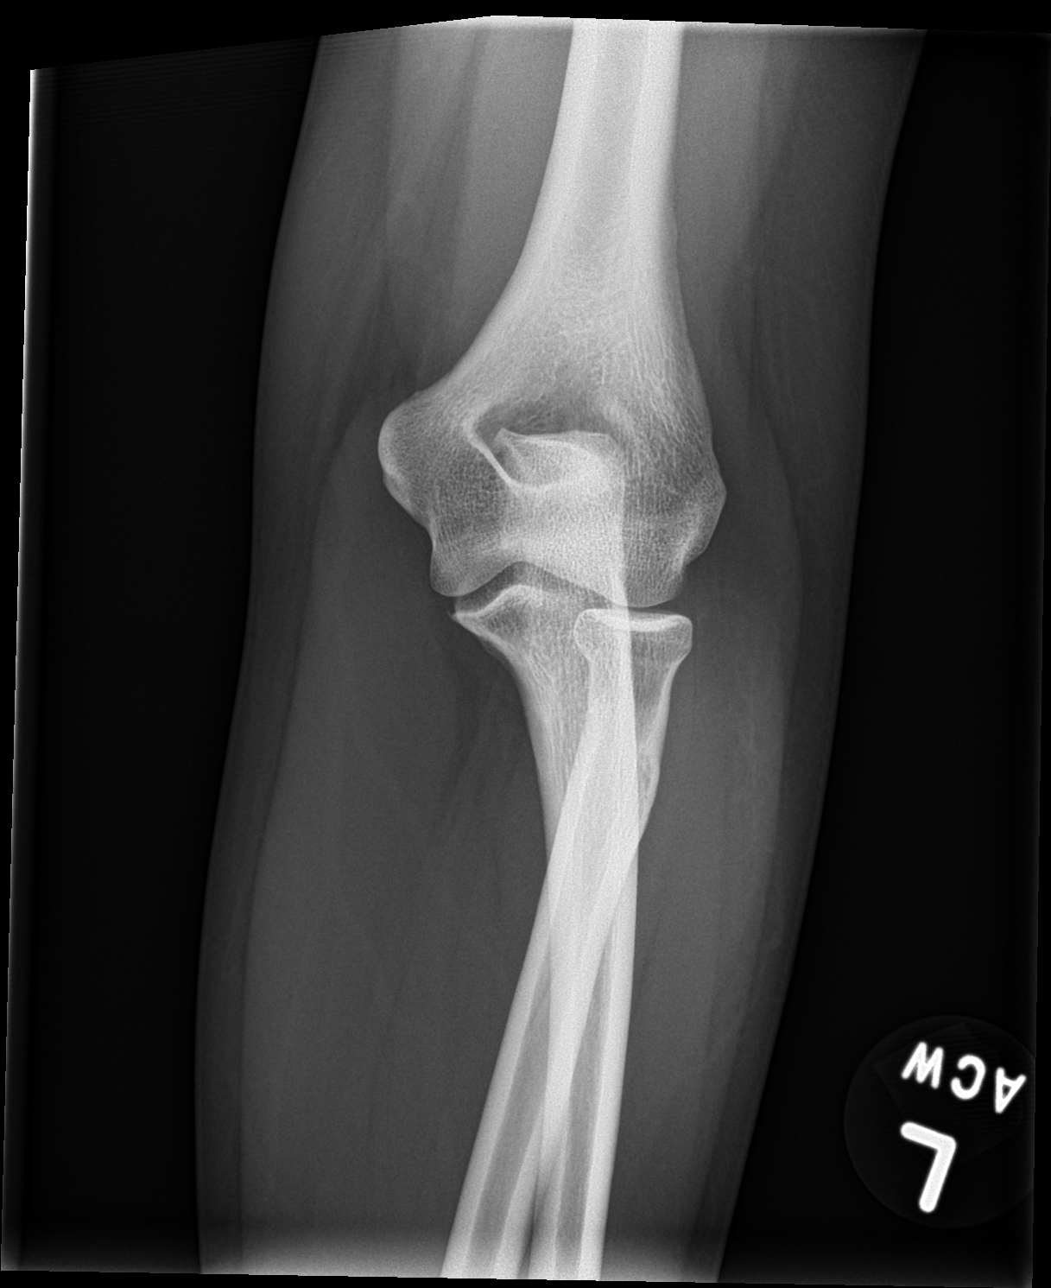

[elbow obl (2 of 2)]
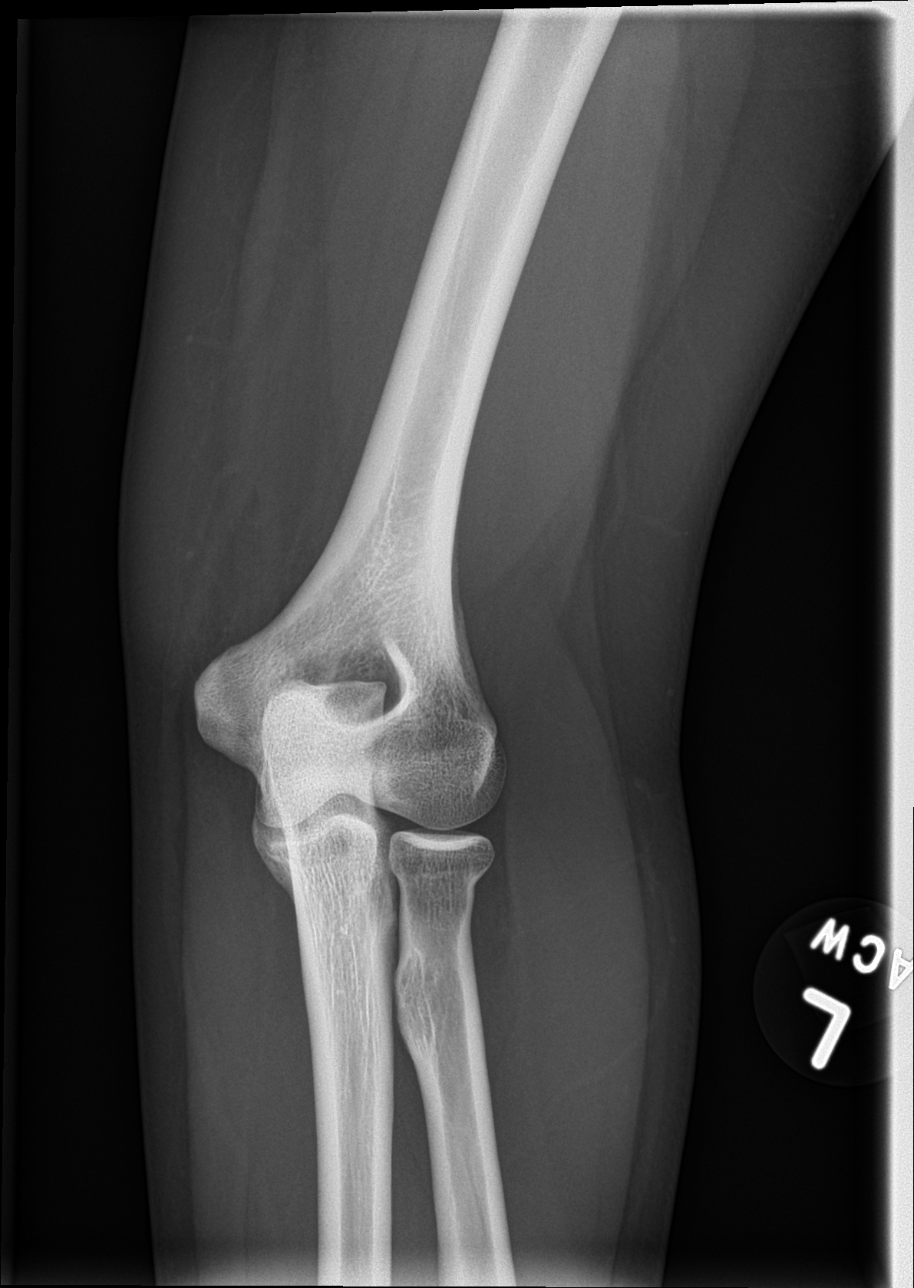

[elbow lat]
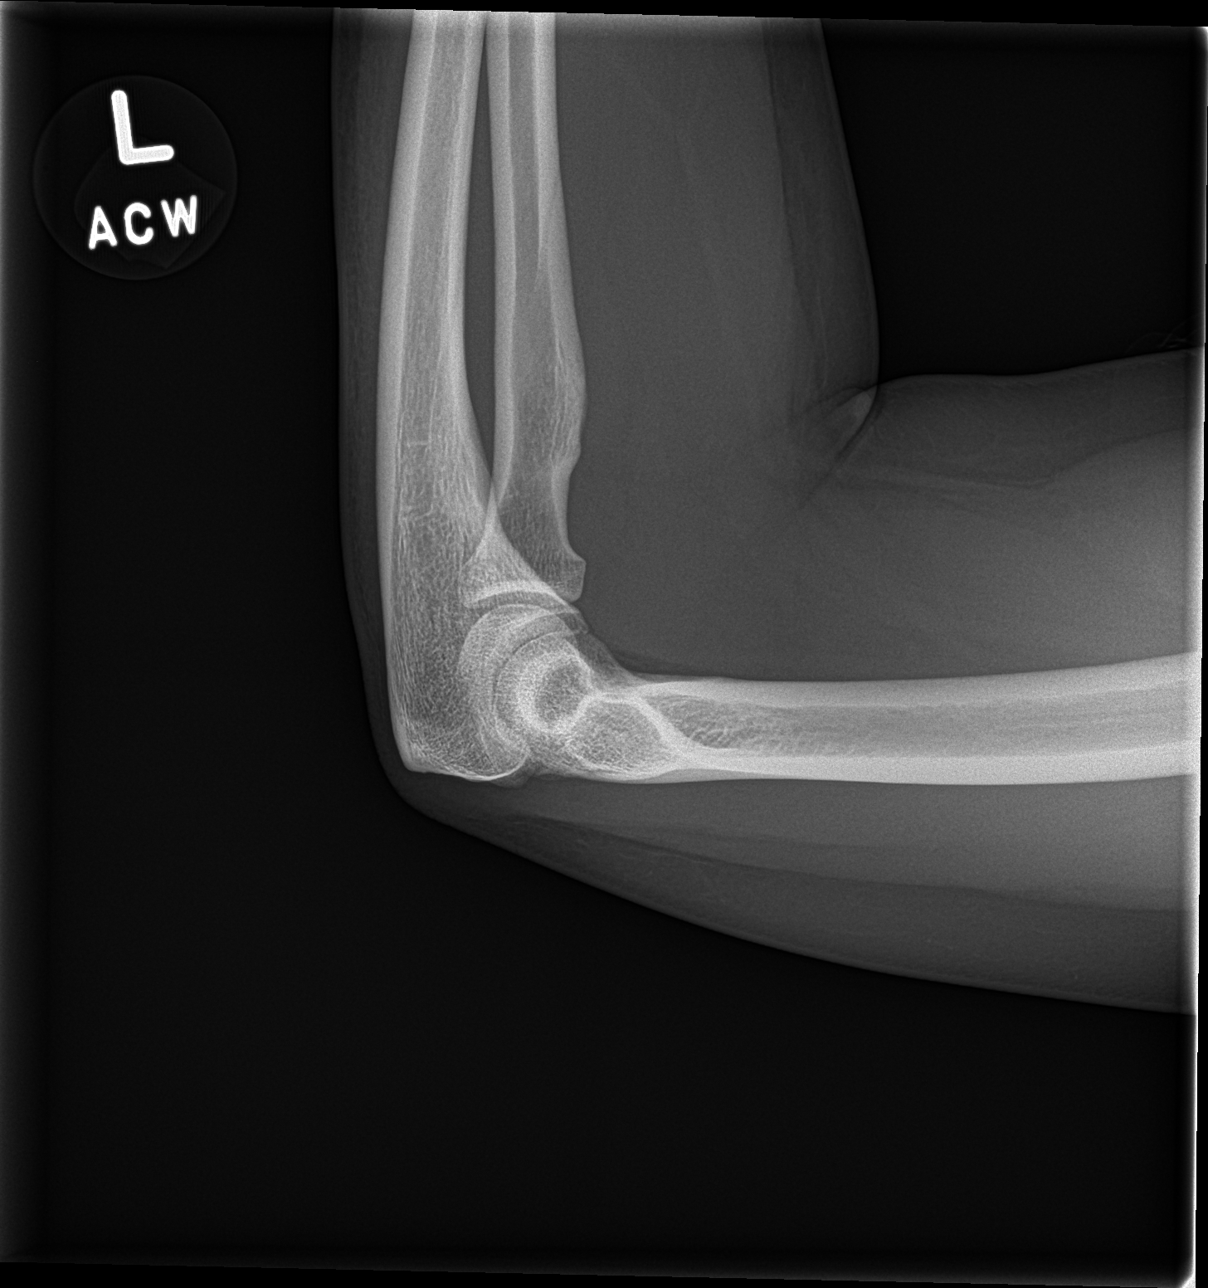

[4 of 4 positions shown; findings below may reference images not displayed]

FINDINGS: There is no evidence of fracture, dislocation, or joint effusion.
There is no evidence of arthropathy or other focal bone abnormality.
Soft tissues are unremarkable.
IMPRESSION: Negative.

## 2024-01-16 IMAGING — MR MR HEAD WO/W CM
13 of 16 series · 36 of 48 positions shown · IV contrast (gadavist)
Comparison: No prior MRI, correlation is made with CT head
07/25/2021

CLINICAL DATA: Multiple seizures, TIA

EXAM:
MRI HEAD WITHOUT AND WITH CONTRAST
TECHNIQUE: Multiplanar, multiecho pulse sequences of the brain and surrounding
structures were obtained without and with intravenous contrast.
CONTRAST:  8mL GADAVIST GADOBUTROL 1 MMOL/ML IV SOLN

[Series 5: ax dwi_tracew · axial · 3.0mm · 0.65mm/px · z∈[-111,+44]mm · 3 of 48 slices shown]
[im 1/48]
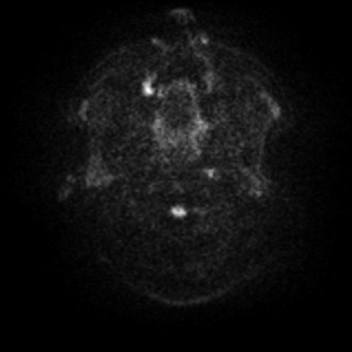
[im 24/48]
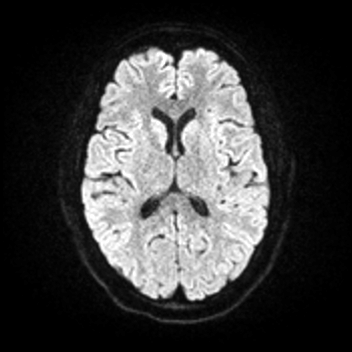
[im 48/48]
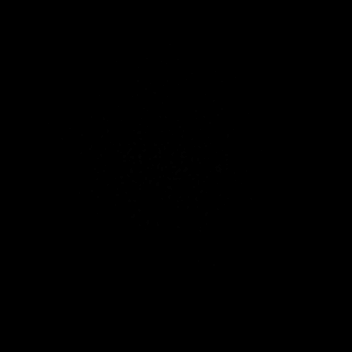

[Series 6: ax dwi_adc · axial · 3.0mm · 0.65mm/px · z∈[-111,+41]mm · 3 of 47 slices shown]
[im 1/47]
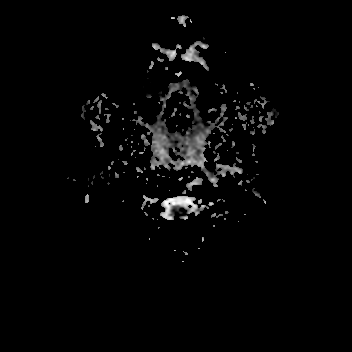
[im 24/47]
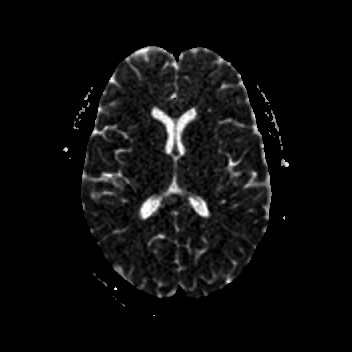
[im 47/47]
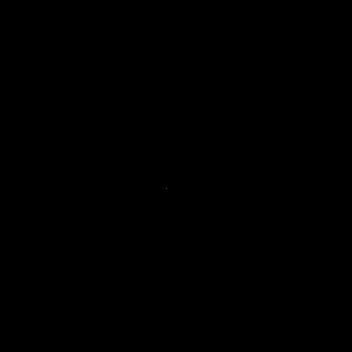

[Series 7: cor dwi_tracew · coronal · 5.0mm · 0.60mm/px · 2 of 34 slices shown]
[im 1/34]
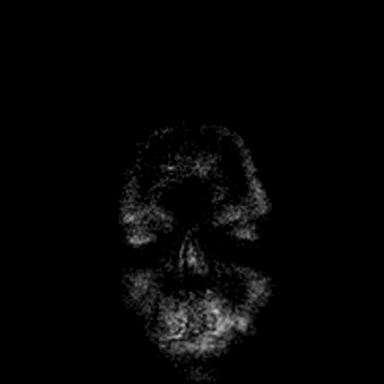
[im 34/34]
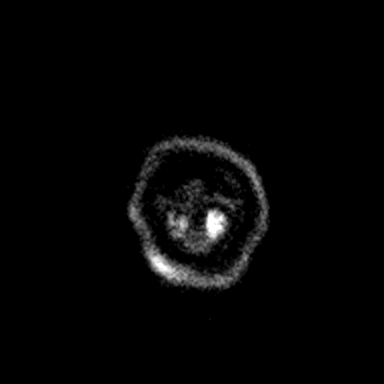

[Series 8: cor dwi_adc · coronal · 5.0mm · 0.60mm/px · 2 of 34 slices shown]
[im 1/34]
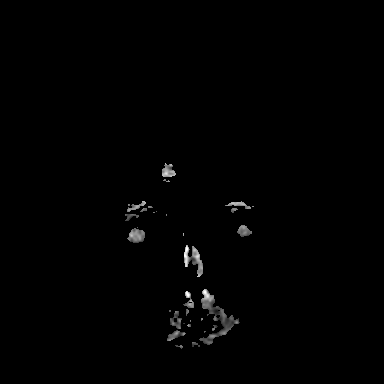
[im 34/34]
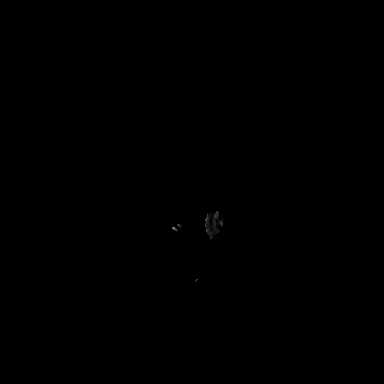

[Series 9: T1 · sagittal · 5.0mm · 0.62mm/px · 1 of 20 slices shown (1 of 2)]
[im 1/20]
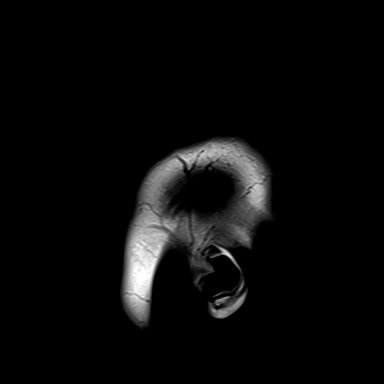

[Series 10: T2 · axial · 5.0mm · 0.53mm/px · 1 of 24 slices shown (1 of 2)]
[im 1/24]
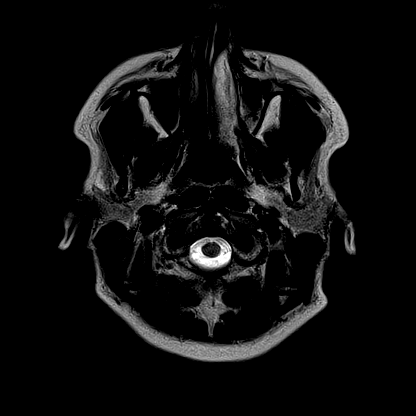

[Series 15: FLAIR · axial · 3.0mm · 0.53mm/px · z∈[-104,+37]mm · 2 of 48 slices shown (1 of 2)]
[im 1/48]
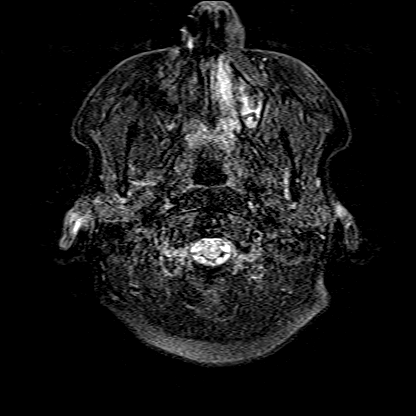
[im 48/48]
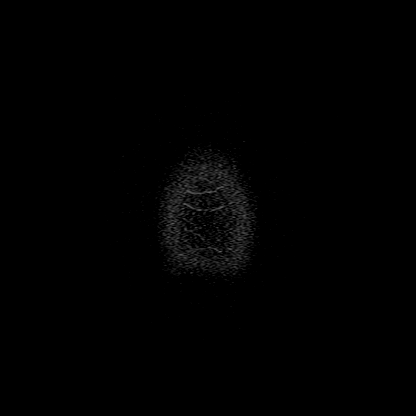

[Series 16: T1 · axial · 1.0mm · 0.98mm/px · z∈[-113,+46]mm · 8 of 160 slices shown (2 of 2)]
[im 1/160]
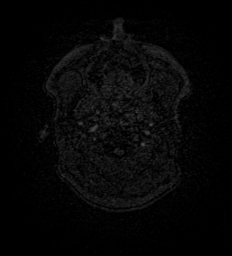
[im 23/160]
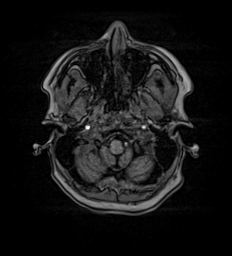
[im 46/160]
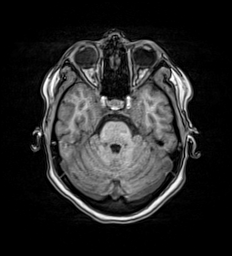
[im 69/160]
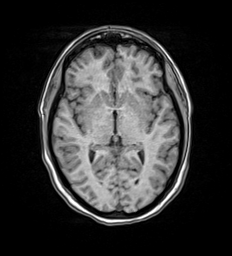
[im 91/160]
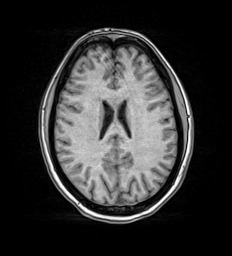
[im 114/160]
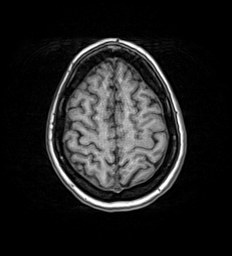
[im 137/160]
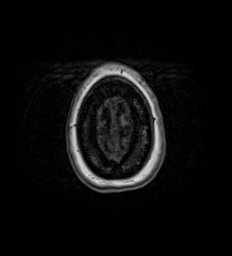
[im 160/160]
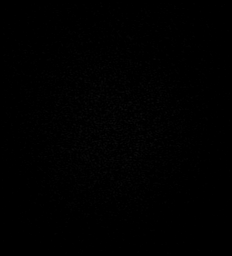

[Series 17: T2 · coronal · 3.0mm · 0.23mm/px · 2 of 34 slices shown (2 of 2)]
[im 1/34]
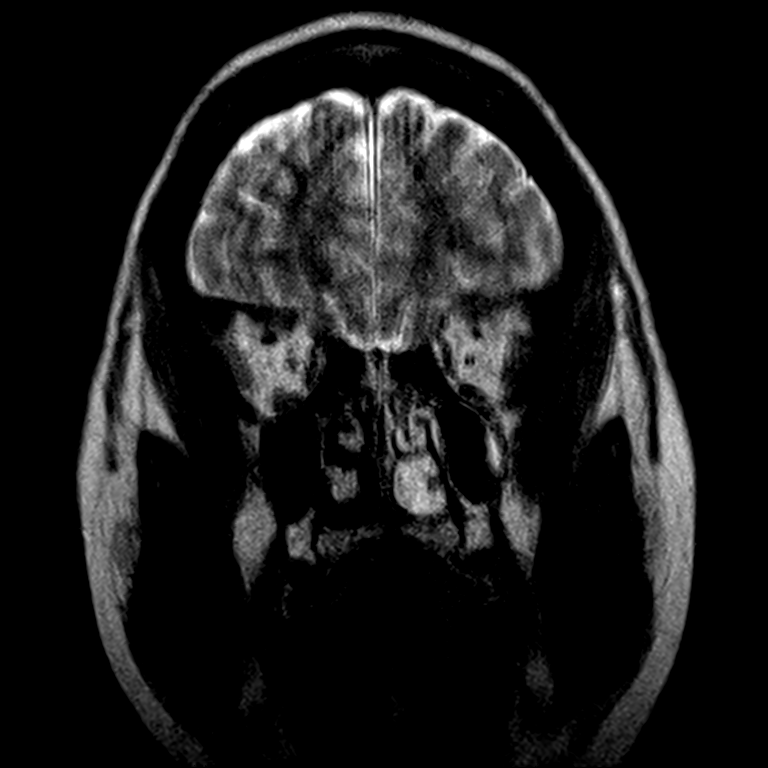
[im 34/34]
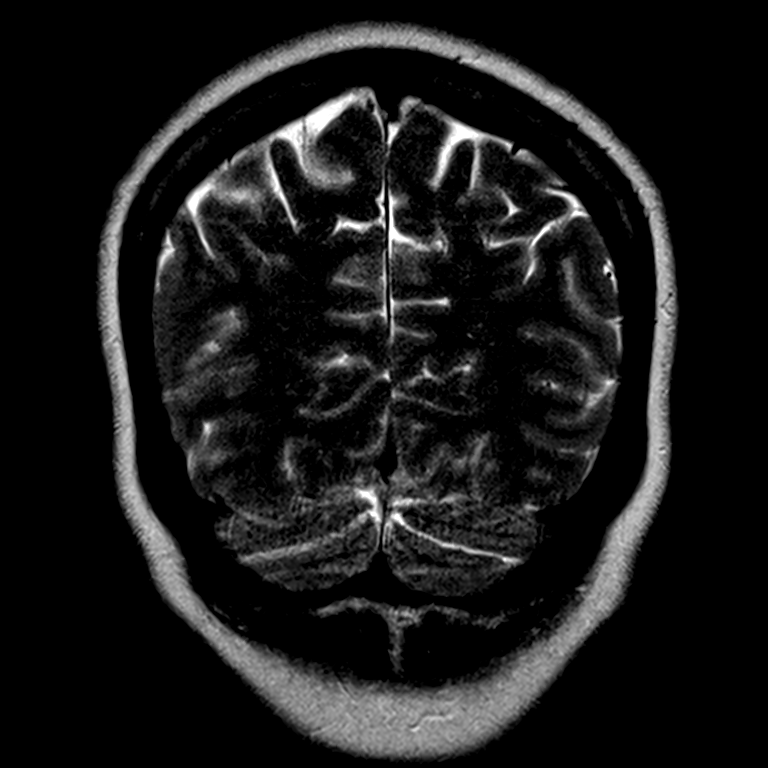

[Series 18: FLAIR · coronal · 3.0mm · 0.35mm/px · 2 of 34 slices shown (2 of 2)]
[im 1/34]
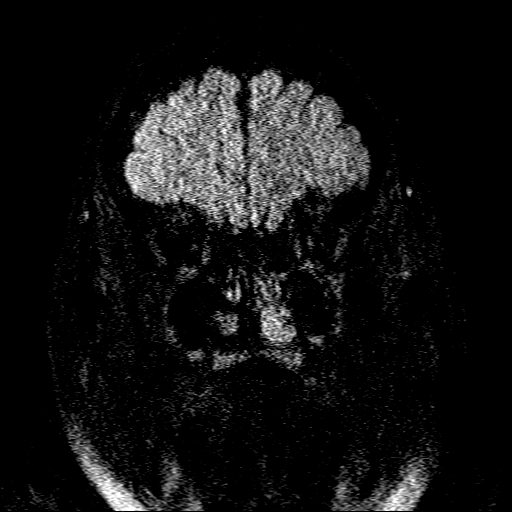
[im 34/34]
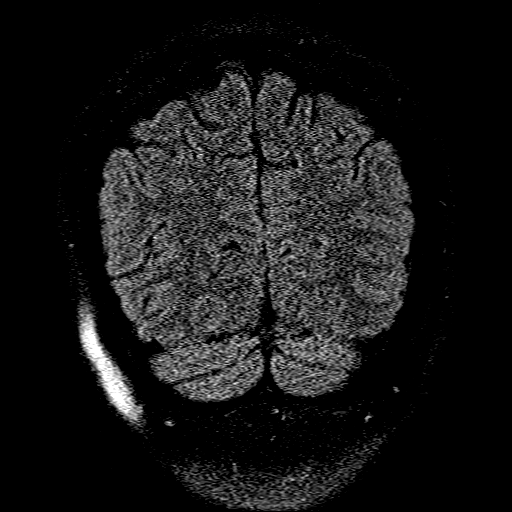

[Series 19: T1 post-contrast · axial · 1.0mm · 0.98mm/px · z∈[-113,+46]mm · 8 of 160 slices shown (1 of 3)]
[im 1/160]
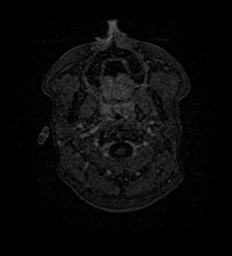
[im 23/160]
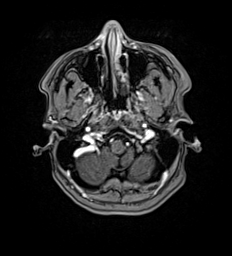
[im 46/160]
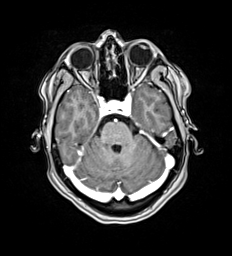
[im 69/160]
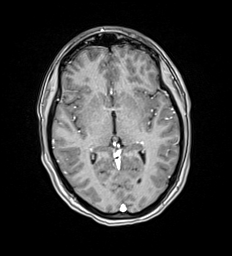
[im 91/160]
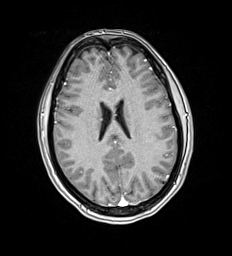
[im 114/160]
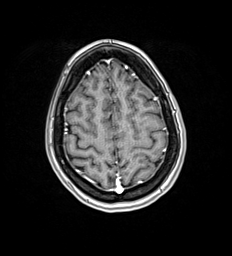
[im 137/160]
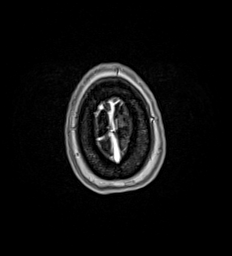
[im 160/160]
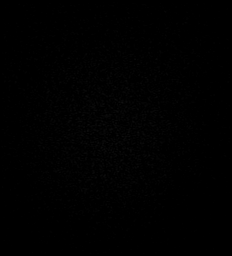

[Series 20: T1 post-contrast · coronal · 5.0mm · 0.57mm/px · 1 of 28 slices shown (2 of 3)]
[im 1/28]
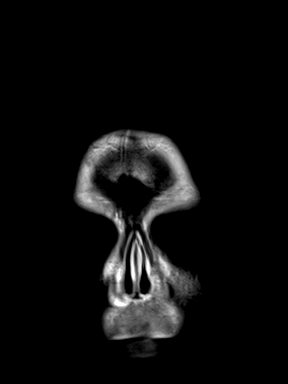

[Series 21: T1 post-contrast · sagittal · 5.0mm · 0.62mm/px · 1 of 20 slices shown (3 of 3)]
[im 1/20]
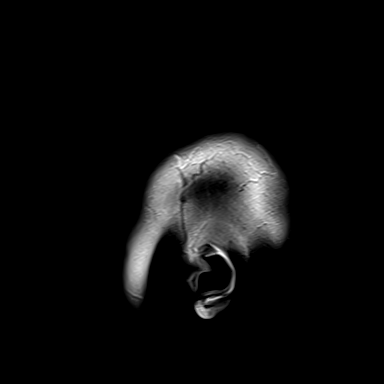

[36 of 48 positions shown; findings below may reference images not displayed]

FINDINGS: Evaluation is somewhat limited by motion artifact.

No evidence of dysgenesis or heterotopia. Brain: No restricted
diffusion to suggest acute or subacute infarct. No acute hemorrhage,
mass, mass effect, or midline shift. No hemosiderin deposition to
suggest remote hemorrhage. No abnormal parenchymal or meningeal
enhancement. No hydrocephalus or extra-axial collection.

The hippocampi are symmetric in size and normal in signal. Possible
pachygyria in the posterior left temporal lobe (series 16, image
76).

Vascular: Normal flow voids.

Skull and upper cervical spine: Normal marrow signal.

Sinuses/Orbits: No acute finding.  Dysconjugate gaze.

Other: The mastoids are well aerated.
IMPRESSION: 1. No acute intracranial process.
2. Possible focus of pachygyria in the posterior left temporal lobe,
although this is not definitely confirmed on reformats. Correlate
with EEG.
